# Patient Record
Sex: Female | Born: 1970 | Race: Black or African American | Hispanic: No | Marital: Married | Smoking: Never smoker
Health system: Southern US, Community
[De-identification: ages and names within clinical notes are randomized; demographics above are authoritative.]

## PROBLEM LIST (undated history)

## (undated) DIAGNOSIS — Z9889 Other specified postprocedural states: Secondary | ICD-10-CM

## (undated) DIAGNOSIS — D649 Anemia, unspecified: Secondary | ICD-10-CM

## (undated) DIAGNOSIS — E669 Obesity, unspecified: Secondary | ICD-10-CM

## (undated) DIAGNOSIS — I509 Heart failure, unspecified: Secondary | ICD-10-CM

## (undated) DIAGNOSIS — O1490 Unspecified pre-eclampsia, unspecified trimester: Secondary | ICD-10-CM

## (undated) DIAGNOSIS — K8020 Calculus of gallbladder without cholecystitis without obstruction: Secondary | ICD-10-CM

## (undated) DIAGNOSIS — I2699 Other pulmonary embolism without acute cor pulmonale: Secondary | ICD-10-CM

## (undated) DIAGNOSIS — R0602 Shortness of breath: Secondary | ICD-10-CM

## (undated) DIAGNOSIS — R112 Nausea with vomiting, unspecified: Secondary | ICD-10-CM

## (undated) DIAGNOSIS — R51 Headache: Secondary | ICD-10-CM

## (undated) DIAGNOSIS — L509 Urticaria, unspecified: Secondary | ICD-10-CM

## (undated) DIAGNOSIS — I10 Essential (primary) hypertension: Secondary | ICD-10-CM

## (undated) HISTORY — PX: HYSTEROSCOPY W/ ENDOMETRIAL ABLATION: SUR665

---

## 1994-05-03 HISTORY — PX: CHOLECYSTECTOMY: SHX55

## 1999-01-02 HISTORY — PX: SKIN BIOPSY: SHX1

## 2002-05-03 HISTORY — PX: DILATION AND CURETTAGE OF UTERUS: SHX78

## 2003-05-04 DIAGNOSIS — I2699 Other pulmonary embolism without acute cor pulmonale: Secondary | ICD-10-CM

## 2003-05-04 HISTORY — DX: Other pulmonary embolism without acute cor pulmonale: I26.99

## 2012-08-25 ENCOUNTER — Emergency Department (HOSPITAL_COMMUNITY): Payer: BC Managed Care – PPO

## 2012-08-25 ENCOUNTER — Encounter (HOSPITAL_COMMUNITY): Payer: Self-pay | Admitting: *Deleted

## 2012-08-25 ENCOUNTER — Inpatient Hospital Stay (HOSPITAL_COMMUNITY)
Admission: EM | Admit: 2012-08-25 | Discharge: 2012-08-28 | DRG: 544 | Disposition: A | Discharge status: Home / Self Care | Payer: BC Managed Care – PPO | Attending: Internal Medicine | Admitting: Internal Medicine

## 2012-08-25 DIAGNOSIS — R7989 Other specified abnormal findings of blood chemistry: Secondary | ICD-10-CM

## 2012-08-25 DIAGNOSIS — R21 Rash and other nonspecific skin eruption: Secondary | ICD-10-CM | POA: Diagnosis present

## 2012-08-25 DIAGNOSIS — I10 Essential (primary) hypertension: Secondary | ICD-10-CM | POA: Diagnosis present

## 2012-08-25 DIAGNOSIS — I517 Cardiomegaly: Secondary | ICD-10-CM | POA: Diagnosis present

## 2012-08-25 DIAGNOSIS — I509 Heart failure, unspecified: Secondary | ICD-10-CM | POA: Diagnosis present

## 2012-08-25 DIAGNOSIS — Z6841 Body Mass Index (BMI) 40.0 and over, adult: Secondary | ICD-10-CM

## 2012-08-25 DIAGNOSIS — J811 Chronic pulmonary edema: Secondary | ICD-10-CM

## 2012-08-25 DIAGNOSIS — R799 Abnormal finding of blood chemistry, unspecified: Secondary | ICD-10-CM

## 2012-08-25 DIAGNOSIS — L509 Urticaria, unspecified: Secondary | ICD-10-CM | POA: Diagnosis present

## 2012-08-25 DIAGNOSIS — R0989 Other specified symptoms and signs involving the circulatory and respiratory systems: Secondary | ICD-10-CM

## 2012-08-25 DIAGNOSIS — R0600 Dyspnea, unspecified: Secondary | ICD-10-CM

## 2012-08-25 DIAGNOSIS — J9600 Acute respiratory failure, unspecified whether with hypoxia or hypercapnia: Secondary | ICD-10-CM | POA: Diagnosis present

## 2012-08-25 DIAGNOSIS — I5031 Acute diastolic (congestive) heart failure: Principal | ICD-10-CM | POA: Diagnosis present

## 2012-08-25 DIAGNOSIS — I43 Cardiomyopathy in diseases classified elsewhere: Secondary | ICD-10-CM | POA: Diagnosis present

## 2012-08-25 DIAGNOSIS — R0902 Hypoxemia: Secondary | ICD-10-CM

## 2012-08-25 DIAGNOSIS — I110 Hypertensive heart disease with heart failure: Secondary | ICD-10-CM | POA: Diagnosis present

## 2012-08-25 DIAGNOSIS — J9601 Acute respiratory failure with hypoxia: Secondary | ICD-10-CM

## 2012-08-25 DIAGNOSIS — R51 Headache: Secondary | ICD-10-CM | POA: Diagnosis present

## 2012-08-25 DIAGNOSIS — R0603 Acute respiratory distress: Secondary | ICD-10-CM

## 2012-08-25 DIAGNOSIS — E669 Obesity, unspecified: Secondary | ICD-10-CM | POA: Diagnosis present

## 2012-08-25 DIAGNOSIS — D72829 Elevated white blood cell count, unspecified: Secondary | ICD-10-CM | POA: Diagnosis present

## 2012-08-25 DIAGNOSIS — Z86718 Personal history of other venous thrombosis and embolism: Secondary | ICD-10-CM | POA: Insufficient documentation

## 2012-08-25 DIAGNOSIS — R0609 Other forms of dyspnea: Secondary | ICD-10-CM

## 2012-08-25 HISTORY — DX: Shortness of breath: R06.02

## 2012-08-25 HISTORY — DX: Headache: R51

## 2012-08-25 HISTORY — DX: Anemia, unspecified: D64.9

## 2012-08-25 HISTORY — DX: Nausea with vomiting, unspecified: R11.2

## 2012-08-25 HISTORY — DX: Obesity, unspecified: E66.9

## 2012-08-25 HISTORY — DX: Essential (primary) hypertension: I10

## 2012-08-25 HISTORY — DX: Calculus of gallbladder without cholecystitis without obstruction: K80.20

## 2012-08-25 HISTORY — DX: Heart failure, unspecified: I50.9

## 2012-08-25 HISTORY — DX: Other pulmonary embolism without acute cor pulmonale: I26.99

## 2012-08-25 HISTORY — DX: Other specified postprocedural states: Z98.890

## 2012-08-25 HISTORY — DX: Urticaria, unspecified: L50.9

## 2012-08-25 HISTORY — DX: Unspecified pre-eclampsia, unspecified trimester: O14.90

## 2012-08-25 LAB — CBC WITH DIFFERENTIAL/PLATELET
Basophils Absolute: 0 10*3/uL (ref 0.0–0.1)
Basophils Relative: 0 % (ref 0–1)
Eosinophils Absolute: 0.1 10*3/uL (ref 0.0–0.7)
Eosinophils Relative: 0 % (ref 0–5)
HCT: 37.3 % (ref 36.0–46.0)
Lymphocytes Relative: 16 % (ref 12–46)
MCH: 27.7 pg (ref 26.0–34.0)
MCHC: 32.4 g/dL (ref 30.0–36.0)
MCV: 85.4 fL (ref 78.0–100.0)
Monocytes Absolute: 0.7 10*3/uL (ref 0.1–1.0)
RDW: 13.2 % (ref 11.5–15.5)

## 2012-08-25 LAB — CBC
MCH: 27.8 pg (ref 26.0–34.0)
MCHC: 32.6 g/dL (ref 30.0–36.0)
MCV: 85.2 fL (ref 78.0–100.0)
Platelets: 174 10*3/uL (ref 150–400)
RDW: 13.2 % (ref 11.5–15.5)

## 2012-08-25 LAB — D-DIMER, QUANTITATIVE: D-Dimer, Quant: 0.9 ug/mL-FEU — ABNORMAL HIGH (ref 0.00–0.48)

## 2012-08-25 LAB — URINALYSIS, ROUTINE W REFLEX MICROSCOPIC
Glucose, UA: NEGATIVE mg/dL
Leukocytes, UA: NEGATIVE
Protein, ur: NEGATIVE mg/dL
Specific Gravity, Urine: 1.023 (ref 1.005–1.030)
Urobilinogen, UA: 0.2 mg/dL (ref 0.0–1.0)

## 2012-08-25 LAB — COMPREHENSIVE METABOLIC PANEL
AST: 14 U/L (ref 0–37)
CO2: 26 mEq/L (ref 19–32)
Calcium: 9.4 mg/dL (ref 8.4–10.5)
Creatinine, Ser: 0.74 mg/dL (ref 0.50–1.10)
GFR calc Af Amer: >90 mL/min (ref >90)
GFR calc non Af Amer: >90 mL/min (ref >90)
Total Protein: 8.3 g/dL (ref 6.0–8.3)

## 2012-08-25 LAB — RAPID URINE DRUG SCREEN, HOSP PERFORMED
Amphetamines: NOT DETECTED
Barbiturates: NOT DETECTED
Benzodiazepines: NOT DETECTED

## 2012-08-25 LAB — TROPONIN I
Troponin I: <0.3 ng/mL (ref <0.30)
Troponin I: <0.3 ng/mL (ref <0.30)

## 2012-08-25 LAB — RPR: RPR Ser Ql: NONREACTIVE

## 2012-08-25 LAB — CREATININE, SERUM: Creatinine, Ser: 0.68 mg/dL (ref 0.50–1.10)

## 2012-08-25 MED ORDER — ENOXAPARIN SODIUM 40 MG/0.4ML ~~LOC~~ SOLN
40.0000 mg | SUBCUTANEOUS | Status: DC
  Administered 2012-08-25 – 2012-08-28 (×4): 40 mg via SUBCUTANEOUS
  Filled 2012-08-25 (×4): qty 0.4

## 2012-08-25 MED ORDER — SODIUM CHLORIDE 0.9 % IJ SOLN: 3.0000 mL | INTRAMUSCULAR | Status: DC | PRN

## 2012-08-25 MED ORDER — METOPROLOL TARTRATE 12.5 MG HALF TABLET
12.5000 mg | ORAL_TABLET | Freq: Two times a day (BID) | ORAL | Status: DC
  Administered 2012-08-25 (×2): 12.5 mg via ORAL
  Filled 2012-08-25 (×4): qty 1

## 2012-08-25 MED ORDER — HYDROMORPHONE HCL PF 1 MG/ML IJ SOLN
1.0000 mg | INTRAMUSCULAR | Status: AC
  Administered 2012-08-25: 1 mg via INTRAVENOUS
  Filled 2012-08-25: qty 1

## 2012-08-25 MED ORDER — ASPIRIN EC 81 MG PO TBEC
81.0000 mg | DELAYED_RELEASE_TABLET | Freq: Every day | ORAL | Status: DC
  Administered 2012-08-25 – 2012-08-28 (×4): 81 mg via ORAL
  Filled 2012-08-25 (×4): qty 1

## 2012-08-25 MED ORDER — HYDROXYZINE HCL 10 MG PO TABS
10.0000 mg | ORAL_TABLET | Freq: Three times a day (TID) | ORAL | Status: DC | PRN
  Administered 2012-08-25: 10 mg via ORAL
  Filled 2012-08-25: qty 1

## 2012-08-25 MED ORDER — HYDROCODONE-ACETAMINOPHEN 5-325 MG PO TABS: 1.0000 | ORAL_TABLET | Freq: Four times a day (QID) | ORAL | Status: DC | PRN

## 2012-08-25 MED ORDER — NITROGLYCERIN IN D5W 200-5 MCG/ML-% IV SOLN
2.0000 ug/min | Freq: Once | INTRAVENOUS | Status: AC
  Administered 2012-08-25: 50 ug/min via INTRAVENOUS
  Filled 2012-08-25: qty 250

## 2012-08-25 MED ORDER — HYDROMORPHONE HCL PF 1 MG/ML IJ SOLN
0.5000 mg | INTRAMUSCULAR | Status: AC
  Administered 2012-08-25: 05:00:00 via INTRAVENOUS
  Filled 2012-08-25: qty 1

## 2012-08-25 MED ORDER — IPRATROPIUM BROMIDE 0.02 % IN SOLN
0.5000 mg | Freq: Once | RESPIRATORY_TRACT | Status: AC
  Administered 2012-08-25: 0.5 mg via RESPIRATORY_TRACT
  Filled 2012-08-25: qty 2.5

## 2012-08-25 MED ORDER — ACETAMINOPHEN 325 MG PO TABS: 650.0000 mg | ORAL_TABLET | ORAL | Status: DC | PRN

## 2012-08-25 MED ORDER — LISINOPRIL 10 MG PO TABS
10.0000 mg | ORAL_TABLET | Freq: Every day | ORAL | Status: DC
  Administered 2012-08-25: 10 mg via ORAL
  Filled 2012-08-25 (×2): qty 1

## 2012-08-25 MED ORDER — SODIUM CHLORIDE 0.9 % IJ SOLN
3.0000 mL | Freq: Two times a day (BID) | INTRAMUSCULAR | Status: DC
  Administered 2012-08-25 – 2012-08-28 (×6): 3 mL via INTRAVENOUS

## 2012-08-25 MED ORDER — FUROSEMIDE 10 MG/ML IJ SOLN
40.0000 mg | Freq: Once | INTRAMUSCULAR | Status: AC
  Administered 2012-08-25: 40 mg via INTRAVENOUS
  Filled 2012-08-25: qty 4

## 2012-08-25 MED ORDER — ONDANSETRON HCL 4 MG/2ML IJ SOLN: 4.0000 mg | Freq: Three times a day (TID) | INTRAMUSCULAR | Status: DC | PRN

## 2012-08-25 MED ORDER — PROMETHAZINE HCL 25 MG/ML IJ SOLN
12.5000 mg | Freq: Once | INTRAMUSCULAR | Status: DC
  Filled 2012-08-25: qty 1

## 2012-08-25 MED ORDER — POTASSIUM CHLORIDE CRYS ER 20 MEQ PO TBCR
20.0000 meq | EXTENDED_RELEASE_TABLET | Freq: Two times a day (BID) | ORAL | Status: DC
  Administered 2012-08-25 – 2012-08-28 (×7): 20 meq via ORAL
  Filled 2012-08-25 (×9): qty 1

## 2012-08-25 MED ORDER — ALBUTEROL SULFATE (5 MG/ML) 0.5% IN NEBU
5.0000 mg | INHALATION_SOLUTION | Freq: Once | RESPIRATORY_TRACT | Status: AC
  Administered 2012-08-25: 5 mg via RESPIRATORY_TRACT
  Filled 2012-08-25: qty 1

## 2012-08-25 MED ORDER — ALBUTEROL SULFATE (5 MG/ML) 0.5% IN NEBU: 2.5000 mg | INHALATION_SOLUTION | RESPIRATORY_TRACT | Status: DC | PRN

## 2012-08-25 MED ORDER — FUROSEMIDE 10 MG/ML IJ SOLN
40.0000 mg | Freq: Every day | INTRAMUSCULAR | Status: DC
  Administered 2012-08-25: 40 mg via INTRAVENOUS
  Filled 2012-08-25 (×2): qty 4

## 2012-08-25 MED ORDER — ONDANSETRON HCL 4 MG/2ML IJ SOLN: 4.0000 mg | Freq: Four times a day (QID) | INTRAMUSCULAR | Status: DC | PRN

## 2012-08-25 MED ORDER — CLOTRIMAZOLE 1 % EX CREA
TOPICAL_CREAM | Freq: Two times a day (BID) | CUTANEOUS | Status: DC
  Administered 2012-08-25 – 2012-08-28 (×7): via TOPICAL
  Filled 2012-08-25: qty 15

## 2012-08-25 MED ORDER — SODIUM CHLORIDE 0.9 % IV SOLN: 250.0000 mL | INTRAVENOUS | Status: DC | PRN

## 2012-08-25 MED ORDER — IOHEXOL 350 MG/ML SOLN
100.0000 mL | Freq: Once | INTRAVENOUS | Status: AC | PRN
  Administered 2012-08-25: 80 mL via INTRAVENOUS

## 2012-08-25 MED ORDER — PROMETHAZINE HCL 25 MG/ML IJ SOLN
25.0000 mg | Freq: Once | INTRAMUSCULAR | Status: AC
  Administered 2012-08-25: 25 mg via INTRAVENOUS
  Filled 2012-08-25: qty 1

## 2012-08-25 NOTE — ED Provider Notes (Signed)
History     CSN: [id number]  Arrival date & time 08/25/12  0122   First MD Initiated Contact with Patient 08/25/12 0131      Chief Complaint  Patient presents with  . Respiratory Distress   History obtained per patient and EMS report HPI patient arrives with respiratory difficulty, she was picked up by EMS for respiratory distress. Patient says she woke up early this morning with a cough that was productive of scant amounts of foamy pink colored sputum, shortness of breath but denies any chest pain. Patient has a history of hypertension, PE and obesity.  Family adds history of medication non-compliance.  Patient's symptoms were severe, have been constant but getting better after CPAP therapy via EMS, 1 sublingual nitroglycerin and one treatment.  O2 sats on arrival were 86%, respiratory rate 36. Per EMS, they said they heard audible rales, wheezing.  No other alleviating or exacerbating factors no associated symptoms. Patient frankly denies any chest pain, belly pain, nausea, vomiting, recent fevers, chills.  Past Medical History  Diagnosis Date  . Hypertension   . Obesity   . Hx of blood clots     Lung    No past surgical history on file.  No family history on file.  History  Substance Use Topics  . Smoking status: Never Smoker   . Smokeless tobacco: Not on file  . Alcohol Use: No    OB History   Grav Para Term Preterm Abortions TAB SAB Ect Mult Living                  Review of Systems At least 10pt or greater review of systems completed and are negative except where specified in the HPI.  Allergies  Review of patient's allergies indicates not on file.  Home Medications  No current outpatient prescriptions on file.  BP 170/142  Pulse 103  Resp 20  SpO2 100%  Physical Exam  Nursing notes reviewed.  Electronic medical record reviewed. VITAL SIGNS:   Filed Vitals:   08/25/12 0132 08/25/12 0136  BP:  170/142  Pulse: 103   Resp: 20   SpO2: 100%     CONSTITUTIONAL: Awake, oriented x4, appears to be having respiratory difficulty, can answer questions follow commands and speak in fragmented sentences HENT: Atraumatic, normocephalic, oral mucosa pink and moist, airway patent. Nares patent without drainage. External ears normal. EYES: Conjunctiva clear, EOMI, PERRLA NECK: Trachea midline, non-tender, supple CARDIOVASCULAR: Normal heart rate, Normal rhythm, No murmurs, rubs, gallops PULMONARY/CHEST: Fair air movement, coarse breath sounds bilaterally, no wheezes or rales. Tachypneic. Symmetrical breath sounds. Non-tender. ABDOMINAL: Non-distended, morbidly obese, soft, non-tender - no rebound or guarding.  BS normal. NEUROLOGIC: Non-focal, moving all four extremities, no gross sensory or motor deficits. EXTREMITIES: No clubbing, cyanosis. 1+ lower extremity pitting edema SKIN: Warm, Dry, No erythema, No rash  ED Course  CRITICAL CARE Performed by: Siple Skene Authorized by: Haverland Skene Total critical care time: 30 minutes Critical care time was exclusive of separately billable procedures and treating other patients. Critical care was necessary to treat or prevent imminent or life-threatening deterioration of the following conditions: respiratory failure. Critical care was time spent personally by me on the following activities: discussions with consultants, evaluation of patient's response to treatment, obtaining history from patient or surrogate, ordering and review of laboratory studies, pulse oximetry, review of old charts, re-evaluation of patient's condition, ordering and review of radiographic studies, ordering and performing treatments and interventions, examination of patient and development of treatment  plan with patient or surrogate.   (including critical care time)  Date: 08/25/2012  Rate: 93  Rhythm: normal sinus rhythm  QRS Axis: normal  Intervals: normal  ST/T Wave abnormalities: Flattening of T waves in V6   Conduction Disutrbances: none  Narrative Interpretation: No prior EKG for comparison, or spleen nonischemic, no ST elevations, ST depressions, mild T-wave flattening in V6 only.     Labs Reviewed  CBC WITH DIFFERENTIAL - Abnormal; Notable for the following:    WBC 13.1 (*)    Neutrophils Relative 79 (*)    Neutro Abs 10.4 (*)    All other components within normal limits  COMPREHENSIVE METABOLIC PANEL - Abnormal; Notable for the following:    Glucose, Bld 143 (*)    All other components within normal limits  PRO B NATRIURETIC PEPTIDE - Abnormal; Notable for the following:    Pro B Natriuretic peptide (BNP) 555.2 (*)    All other components within normal limits  D-DIMER, QUANTITATIVE - Abnormal; Notable for the following:    D-Dimer, Quant 0.90 (*)    All other components within normal limits  URINALYSIS, ROUTINE W REFLEX MICROSCOPIC  POCT I-STAT TROPONIN I  POCT PREGNANCY, URINE   Ct Angio Chest Pe W/cm &/or Wo Cm  08/25/2012  *RADIOLOGY REPORT*  Clinical Data: Shortness of breath.  CT ANGIOGRAPHY CHEST  Technique:  Multidetector CT imaging of the chest using the standard protocol during bolus administration of intravenous contrast. Multiplanar reconstructed images including MIPs were obtained and reviewed to evaluate the vascular anatomy.  Contrast: 80mL OMNIPAQUE IOHEXOL 350 MG/ML SOLN  Comparison: 08/25/2012 radiograph  Findings: Contrast bolus timing suboptimal.  Allowing for this, no main or central lobar pulmonary arterial branch filling defect. More peripheral branches are inadequately opacified.  Normal caliber aorta.  Cardiomegaly.  No pleural or pericardial effusion.  Limited images through the upper abdomen show no acute finding. Cholecystectomy.  Reactive sized mediastinal and hilar lymph nodes.  Bilateral ground-glass opacities, right greater than left, within the upper and lower lobes.  No pneumothorax.  Central airways are in expiration however patent.  No acute osseous  finding.  IMPRESSION: Suboptimal contrast bolus timing.  No main or central lobar pulmonary embolism.  The more peripheral branches are nondiagnostic.  Cardiomegaly.  Bilateral ground-glass opacities may reflect pulmonary edema, infectious or inflammatory pneumonitis.   Original Report Authenticated By: Jearld Lesch, M.D.    Dg Chest Port 1 View  08/25/2012  *RADIOLOGY REPORT*  Clinical Data: Respiratory distress  PORTABLE CHEST - 1 VIEW  Comparison: None.  Findings: Cardiomegaly.  Mediastinal contours otherwise within normal range.  Mild interstitial prompts.  No confluent airspace opacity.  No pleural effusion or pneumothorax.  No acute osseous finding.  IMPRESSION: Cardiomegaly.  Mild interstitial prominence may be accentuated by portable technique and patient body habitus. Mild interstitial edema not excluded.   Original Report Authenticated By: Jearld Lesch, M.D.      1. Respiratory distress   2. Elevated brain natriuretic peptide (BNP) level   3. Dyspnea   4. Hypoxia   5. Pulmonary edema     Medications  promethazine (PHENERGAN) injection 12.5 mg (not administered)  furosemide (LASIX) injection 40 mg (40 mg Intravenous Given 08/25/12 0153)  albuterol (PROVENTIL) (5 MG/ML) 0.5% nebulizer solution 5 mg (5 mg Nebulization Given 08/25/12 0207)  ipratropium (ATROVENT) nebulizer solution 0.5 mg (0.5 mg Nebulization Given 08/25/12 0207)  nitroGLYCERIN 0.2 mg/mL in dextrose 5 % infusion (0 mcg/min Intravenous Stopped 08/25/12 0714)  HYDROmorphone (DILAUDID) injection 0.5 mg ( Intravenous Given 08/25/12 0443)  iohexol (OMNIPAQUE) 350 MG/ML injection 100 mL (80 mLs Intravenous Contrast Given 08/25/12 0522)  HYDROmorphone (DILAUDID) injection 1 mg (1 mg Intravenous Given 08/25/12 0719)  promethazine (PHENERGAN) injection 25 mg (25 mg Intravenous Given 08/25/12 0719)     MDM  Jacqueline Kirk is a 42 y.o. female presenting in respiratory distress via EMS, patient was given nitroglycerin on CPAP in  the field but she responded very well to, patient appears to be responding well secondary to increased preload, blood pressure is extremely high. Patient given 2 sublingual nitroglycerin to reduce her blood pressure, then put on a nitroglycerin drip. Respiratory: The bedside immediately put on BiPAP. Patient is having no chest pain, she's able to give me history, she's had some medication noncompliance and history of hypertension.   Likely flash pulmonary edema secondary to hypertensive emergency. Patient does have a history of pulmonary embolism. Will get the patient stabilized and likely require a CT angiogram of the chest, will obtain a d-dimer although my suspicion is that it will be positive. I do have a low suspicion that pathology today is caused by a PE.   Patient given Lasix, kept on a nitroglycerin drip, blood pressures into the 150s and 160s, patient had a large amount of urine output. Chest x-ray consistent with some pulmonary edema.  Patient was able to be transitioned to nonrebreather mask.  CT of the chest shows a suboptimal contrast bolus however no PE in the main or central lobar arteries.  CT also comments on bilateral groundglass opacities may reflect re\re pulmonary edema. Also reflects on cardiomegaly. Patient does have obvious cardiomegaly on x-ray, though it was a portable x-ray.    I think the patient will need to be admitted although she is off oxygen at this point, she is shown signs of hypertensive emergency as well as systolic heart failure which is new onset. She has no history of this previously and she has an enlarged heart. She has an increased BNP. Discussed patient with Dr. Malachi Bonds for admission to telemetry, observation.        Sou Skene, MD 08/25/12 [phone]

## 2012-08-25 NOTE — ED Notes (Addendum)
Coming from home; woke up with sob; ems on arrival- audible rales, sats 80's; placed on cpap 5, 5mg  alubterol, piv 20 rt. A/c. Vs. Bp. 190/110, hr 100, sao2 98, and rr 30s. ems gave nitro sl. 0.4mg  to lower bp. Productive cough started tonite.

## 2012-08-25 NOTE — Plan of Care (Signed)
Problem: Food- and Nutrition-Related Knowledge Deficit (NB-1.1) Goal: Nutrition education Formal process to instruct or train a patient/client in a skill or to impart knowledge to help patients/clients voluntarily manage or modify food choices and eating behavior to maintain or improve health. Outcome: Completed/Met Date Met:  08/25/12 Nutrition Education Note  RD consulted for nutrition education regarding new onset CHF.   Pt did not want to talk about her diet, on several occasions roller her eyes and stated "I'm not doing that".  Family in room is a little more receptive, but still with limited understanding of how important a low sodium diet is for the pt. Pt and family denied knowing that the pt has any heart problems, only report high BP.  Spoke with RN about confirming CHF with pt/family. Encouraged family that this diet would be beneficial for all members of the household, though they state they will not give up salt.   RD provided "Heart Healthy Nutrition Therapy" handout from the [employer]. Reviewed patient's dietary recall. Provided examples on ways to decrease sodium and fat intake in diet. Discouraged intake of processed foods and use of salt shaker. Encouraged fresh fruits and vegetables as well as whole grain sources of carbohydrates to maximize fiber intake.     Teach back method used.  Expect poor compliance.  Body mass index is 65.51 kg/(m^2). Pt meets criteria for obesity class 3, extreme  based on current BMI.  Current diet order is heart, no meals documented at this time. Pt/family had fast food items in room. Labs and medications reviewed. No further nutrition interventions warranted at this time. RD contact information provided. If additional nutrition issues arise, please re-consult RD.  Clarene Duke RD, LDN Pager [phone] After Hours pager [phone]

## 2012-08-25 NOTE — H&P (Signed)
[facility] History and Physical  Phylisha Dix ZOX:[id number] DOB: January 06, 1971 DOA: 08/25/2012  Referring physician:  Dr. Rulon Abide PCP:  No PCP Per Patient   Chief Complaint:  Shortness of breath  HPI:  The patient is a 42 y.o. year-old female with history of HTN, obesity, and pregnancy-related PE who presents with acute onset shortness of breth.  The patient was last at their baseline health a week ago.  She has had intermittent hives for the last week with some itching and numbness of the throat.  Otherwise, she has felt well.  She has had some occasional chills, but denies recent fevers, rhinorrhea, sinus congestion, cough, PND, orthopnea, LEE.  Yesterday, she developed some cough which worsened in the evening, becoming productive of pink frothy sputum.  She had associated wheezing and SOB.  Her family called EMS when she started having difficulty breathing.  When EMS arrived, she had audible rales and wheezing from across the room and was in moderate respiratory distress.  She vomited once.  Her BP was 190/110, RR 30s, O2 sat in 80s.  She was given a nebulizer treatment which helped a little.  She was also given NTG and started on cpap +5, which helped her breathing.  In the ER, she was started on a NTG gtt and given lasix.  She has made "a lot" but unquantified amount of urine.  Her breathing improved and she was weaned from the NTG gtt and oxygen.  CXR demonstrated pulmonary edema vs. PNA and she underwent CTa chest due to elevated D-dimer and hx of PE which was negative for PE.  CXR and CTa both demonstrated pulmonary edema and cardiomegaly.   She is being admitted for work up of probable new onset systolic heart failure with acute exacerbation.    Review of Systems:  Denies fevers, weight loss or gain, changes to hearing and vision.  Denies rhinorrhea, sinus congestion, sore throat.  Denies chest pain and palpitations.  + SOB, wheezing, cough.  Denies prior nausea, vomiting, constipation,  diarrhea.  Denies dysuria, frequency, urgency, polyuria, polydipsia.  Denies hematemesis, blood in stools, melena, abnormal bruising or bleeding.  Denies lymphadenopathy.  Denies arthralgias, myalgias.  Has a rash on the lateral aspect of the right ankle.  Denies lower extremity edema.  Numbness of mouth has since resolved.  Denies weakness, slurred speech, confusion, facial droop.  Denies anxiety and depression.  Headache in ER was treated with dilaudid.    Past Medical History  Diagnosis Date  . Hypertension   . Obesity   . Hx of blood clots     Lung  . Hives   . Gallstones     s/p cholecystectomy  . Preeclampsia    Past Surgical History  Procedure Laterality Date  . Cholecystectomy    . Ablation      "so I can't have kids"   Social History:  reports that she has never smoked. She does not have any smokeless tobacco history on file. She reports that she does not drink alcohol or use illicit drugs. Lives with fiance and two daughters.  Works at [employer] and completes ADLs without assistance  No Known Allergies  Family History  Problem Relation Age of Onset  . Stroke Brother   . Diabetes Brother   . Cirrhosis      multiple family members  . Alcoholism      multiple family members     Prior to Admission medications   Medication Sig Start Date End Date Taking?  Authorizing Provider  lisinopril (PRINIVIL,ZESTRIL) 10 MG tablet Take 10 mg by mouth daily.   Yes Historical Provider, MD   Physical Exam: Filed Vitals:   08/25/12 0715 08/25/12 0730 08/25/12 0745 08/25/12 0800  BP: 166/78 165/88 121/107 141/96  Pulse:  74 79 82  Resp: 18 15 17 23   SpO2: 94% 91% 93% 98%     General:  Obese AAF, no acute distress, lying on stretcher  Eyes:  Pupils pinpoint and minimally reactive to light, anicteric, non-injected.  ENT:  Nares clear.  OP clear, non-erythematous without plaques or exudates.  MMM.   Neck:  Supple without TM or JVD.    Lymph:  No cervical, supraclavicular,  or submandibular LAD.  Cardiovascular:  RRR, normal S1, S2, without m/r/g.  2+ pulses, warm extremities  Respiratory:  Rales at the bilateral bases without wheeze or rhonchi, no increased WOB.  Abdomen:  NABS.  Soft, ND/NT.    Skin:   Right ankle with a 5cm circular plaque with raised and lichenified border and central clearing/scarring.    Musculoskeletal:  Normal bulk and tone.  No LE edema.  Psychiatric:  Asleep and difficult to arouse long enough to answer questions.  Appropriate affect.  Neurologic:  Grossly intact.  Grossly moves all extremities.  No facial droop.      Labs on Admission:  Basic Metabolic Panel:  Recent Labs Lab 08/25/12 0332  NA 139  K 3.5  CL 101  CO2 26  GLUCOSE 143*  BUN 12  CREATININE 0.74  CALCIUM 9.4   Liver Function Tests:  Recent Labs Lab 08/25/12 0332  AST 14  ALT 6  ALKPHOS 87  BILITOT 0.3  PROT 8.3  ALBUMIN 3.6   No results found for this basename: LIPASE, AMYLASE,  in the last 168 hours No results found for this basename: AMMONIA,  in the last 168 hours CBC:  Recent Labs Lab 08/25/12 0332  WBC 13.1*  NEUTROABS 10.4*  HGB 12.1  HCT 37.3  MCV 85.4  PLT 187   Cardiac Enzymes: No results found for this basename: CKTOTAL, CKMB, CKMBINDEX, TROPONINI,  in the last 168 hours  BNP (last 3 results)  Recent Labs  08/25/12 0332  PROBNP 555.2*   CBG: No results found for this basename: GLUCAP,  in the last 168 hours  Radiological Exams on Admission: Ct Angio Chest Pe W/cm &/or Wo Cm  08/25/2012  *RADIOLOGY REPORT*  Clinical Data: Shortness of breath.  CT ANGIOGRAPHY CHEST  Technique:  Multidetector CT imaging of the chest using the standard protocol during bolus administration of intravenous contrast. Multiplanar reconstructed images including MIPs were obtained and reviewed to evaluate the vascular anatomy.  Contrast: 80mL OMNIPAQUE IOHEXOL 350 MG/ML SOLN  Comparison: 08/25/2012 radiograph  Findings: Contrast bolus  timing suboptimal.  Allowing for this, no main or central lobar pulmonary arterial branch filling defect. More peripheral branches are inadequately opacified.  Normal caliber aorta.  Cardiomegaly.  No pleural or pericardial effusion.  Limited images through the upper abdomen show no acute finding. Cholecystectomy.  Reactive sized mediastinal and hilar lymph nodes.  Bilateral ground-glass opacities, right greater than left, within the upper and lower lobes.  No pneumothorax.  Central airways are in expiration however patent.  No acute osseous finding.  IMPRESSION: Suboptimal contrast bolus timing.  No main or central lobar pulmonary embolism.  The more peripheral branches are nondiagnostic.  Cardiomegaly.  Bilateral ground-glass opacities may reflect pulmonary edema, infectious or inflammatory pneumonitis.   Original Report Authenticated By:  Jearld Lesch, M.D.    Dg Chest Port 1 View  08/25/2012  *RADIOLOGY REPORT*  Clinical Data: Respiratory distress  PORTABLE CHEST - 1 VIEW  Comparison: None.  Findings: Cardiomegaly.  Mediastinal contours otherwise within normal range.  Mild interstitial prompts.  No confluent airspace opacity.  No pleural effusion or pneumothorax.  No acute osseous finding.  IMPRESSION: Cardiomegaly.  Mild interstitial prominence may be accentuated by portable technique and patient body habitus. Mild interstitial edema not excluded.   Original Report Authenticated By: Jearld Lesch, M.D.     EKG:  NSR, rate 93, PVC.  Possibly flattened or inverted T-waves in the lateral leads , I, aVL, but difficult to tell given baseline.  No ST segment depressions or elevations.    Assessment/Plan Principal Problem:   Pulmonary edema Active Problems:   Hypertension   Obesity   Cardiomegaly   Hives   Rash and nonspecific skin eruption   Acute respiratory failure with hypoxia   Leukocytosis  Cardiomegaly and pulmonary edema, likely acute systolic heart failure, new onset with resultant  acute hypoxic respiratory failure which required bipap.  May also have had acute diastolic heart failure from stressor or medication.  Systolic heart failure:  Family hx of EtOH use, however, pt denies.  Will rule out ischemia, thyroid, lupus, hemochromatosis.  Patient too sleepy to consent for HIV testing at this time.   -  Admit to observation and CM to review for possible admission status -  Telemetry -  Cycle troponins -  Strict I/O, daily weights -  Lasix 40mg  IV once daily -  Potassium BID -  ECHO with contrast ASAP -  Continue ACEI and start low dose BB and baby aspirin -  Ferritin, ANA, TSH, RPR -  Cardiology consult for new onset systolic heart failure if present -  Monitor for evidence of pneumonia -  Consent for HIV testing once more awake -  Avoid NSAIDs  -  Urine drug screen  Hives, intermittent for the last week.  May be environmental allergy or reaction to viral illness.  -  Hydroxyzine prn  HA, likely related to NTG -  Minimize narcotics  Rash on right lower extremity:  May be tinea corp -  Start clotrimazole cream  HTN, blood pressure was elevated at home but currently wnl.  -  Continue lisinopril at home dose to titrate as BP tolerates -  Add BB  Leukocytosis, likely reactive from dyspnea.  CXR findings more consistent with pulmonary edema and pneumonia would not have improved with diuresis.   -  F/u UA -  Trend WBC  Obesity:    -  Nutrition consult  Diet:  Healthy heart, 2gm sodium, 1.5L  Access:  PIV IVF:  OFF Proph:  lovenox  Code Status: full Family Communication: spoke with patient, her daughter and her friend Disposition Plan: admit to observation telemetry  Time spent: 60 min  Renae Fickle [facility] Pager [phone]  If 7PM-7AM, please contact night-coverage [email or web address] Password [facility] 08/25/2012, 8:22 AM

## 2012-08-25 NOTE — Progress Notes (Signed)
  Echocardiogram 2D Echocardiogram has been performed.  Georgian Co 08/25/2012, 5:48 PM

## 2012-08-25 NOTE — ED Notes (Signed)
Hospitalist at the bedside 

## 2012-08-26 DIAGNOSIS — J9600 Acute respiratory failure, unspecified whether with hypoxia or hypercapnia: Secondary | ICD-10-CM

## 2012-08-26 LAB — BASIC METABOLIC PANEL
CO2: 25 mEq/L (ref 19–32)
Chloride: 104 mEq/L (ref 96–112)
GFR calc non Af Amer: 87 mL/min — ABNORMAL LOW (ref >90)
Glucose, Bld: 103 mg/dL — ABNORMAL HIGH (ref 70–99)
Potassium: 4.1 mEq/L (ref 3.5–5.1)
Sodium: 138 mEq/L (ref 135–145)

## 2012-08-26 LAB — CBC
HCT: 36 % (ref 36.0–46.0)
Hemoglobin: 11.6 g/dL — ABNORMAL LOW (ref 12.0–15.0)
MCH: 27.6 pg (ref 26.0–34.0)
RBC: 4.2 MIL/uL (ref 3.87–5.11)

## 2012-08-26 LAB — TROPONIN I: Troponin I: <0.3 ng/mL (ref <0.30)

## 2012-08-26 MED ORDER — FUROSEMIDE 10 MG/ML IJ SOLN
40.0000 mg | Freq: Two times a day (BID) | INTRAMUSCULAR | Status: DC
  Administered 2012-08-26 – 2012-08-28 (×5): 40 mg via INTRAVENOUS
  Filled 2012-08-26 (×6): qty 4

## 2012-08-26 MED ORDER — LISINOPRIL 20 MG PO TABS
20.0000 mg | ORAL_TABLET | Freq: Every day | ORAL | Status: DC
  Administered 2012-08-26 – 2012-08-28 (×3): 20 mg via ORAL
  Filled 2012-08-26 (×3): qty 1

## 2012-08-26 MED ORDER — AMLODIPINE BESYLATE 5 MG PO TABS
5.0000 mg | ORAL_TABLET | Freq: Every day | ORAL | Status: DC
  Administered 2012-08-26 – 2012-08-27 (×2): 5 mg via ORAL
  Filled 2012-08-26 (×3): qty 1

## 2012-08-26 MED ORDER — BUTALBITAL-APAP-CAFFEINE 50-325-40 MG PO TABS
1.0000 | ORAL_TABLET | ORAL | Status: DC | PRN
  Administered 2012-08-26: 1 via ORAL
  Filled 2012-08-26: qty 1

## 2012-08-26 MED ORDER — METOPROLOL TARTRATE 25 MG PO TABS
25.0000 mg | ORAL_TABLET | Freq: Two times a day (BID) | ORAL | Status: DC
  Administered 2012-08-26 – 2012-08-28 (×5): 25 mg via ORAL
  Filled 2012-08-26 (×6): qty 1

## 2012-08-26 NOTE — Progress Notes (Signed)
[facility] PROGRESS NOTE  Jacqueline Kirk WUJ:[id number] DOB: 01/05/1971 DOA: 08/25/2012 PCP: No PCP Per Patient  Assessment/Plan:  Cardiomegaly and pulmonary edema, secondary to  acute congestive heart failure, new onset with resultant acute hypoxic respiratory failure which required bipap briefly in the ED. 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 May be environmental allergy or reaction to viral illness.  - Hydroxyzine prn    Headache  patient with history of migraines. Plan for Fioricet prn     Rash on right lower extremity: May be tinea corp  - Start clotrimazole cream   HTN, blood pressure was elevated at home but currently wnl.  - started lisinopril at home dose and titrated up to 20 mg daily 4/26. BB added 4/25 and titrated up on 4/26. Norvasc 5 QHS added 4/26   Leukocytosis, likely reactive from pulm edema  Resolved by 4/26    Code Status: full Family Communication: daughter  Disposition Plan: home   HPI/Subjective: C/o swelling   Objective: Filed Vitals:   08/25/12 1127 08/25/12 1504 08/25/12 2043 08/26/12 0607  BP: 136/78 131/71 152/101 179/109  Pulse: 76 91 88 79  Temp:  98.3 F (36.8 C) 99.2 F (37.3 C) 98.1 F (36.7 C)  TempSrc:  Oral Oral Oral  Resp:  16 18 18   Height:      Weight:    146.6 kg (323 lb 3.1 oz)  SpO2:  100% 96% 99%   Patient Vitals for the past 24 hrs:  BP Temp Temp src Pulse Resp SpO2 Height Weight  08/26/12 0607 179/109 mmHg 98.1 F (36.7 C) Oral 79 18 99 % - 146.6 kg (323 lb 3.1 oz)  08/25/12 2043 152/101 mmHg 99.2 F (37.3 C) Oral 88 18 96 % - -  08/25/12 1504 131/71 mmHg 98.3 F (36.8 C) Oral 91 16 100 % - -  08/25/12 1127 136/78 mmHg - - 76  - - - -  08/25/12 0923 151/88 mmHg 98 F (36.7 C) Oral 79 16 99 % 4\' 11"  (1.499 m) 147.2 kg (324 lb 8.3 oz)  08/25/12 0902 - 98.5 F (36.9 C) Oral - - - - -  08/25/12 0830 146/86 mmHg - - 66 15 99 % - -  08/25/12 0815 146/84 mmHg - - 70 15 96 % - -  08/25/12 0800 141/96 mmHg - - 82 23 98 % - -  08/25/12 0745 121/107 mmHg - - 79 17 93 % - -     Intake/Output Summary (Last 24 hours) at 08/26/12 0739 Last data filed at 08/26/12 9562  Gross per 24 hour  Intake    480 ml  Output   2650 ml  Net  -2170 ml   Filed Weights   08/25/12 0923 08/26/12 0607  Weight: 147.2 kg (324 lb 8.3 oz) 146.6 kg (323 lb 3.1 oz)    Exam:   General:  axox3  Cardiovascular: rrr  Respiratory: bilat crackles   Abdomen: soft  Musculoskeletal: edema    Data Reviewed: Basic Metabolic Panel:  Recent Labs Lab 08/25/12 0332 08/25/12 0956  NA 139  --   K 3.5  --   CL 101  --   CO2 26  --  GLUCOSE 143*  --   BUN 12  --   CREATININE 0.74 0.68  CALCIUM 9.4  --    Liver Function Tests:  Recent Labs Lab 08/25/12 0332  AST 14  ALT 6  ALKPHOS 87  BILITOT 0.3  PROT 8.3  ALBUMIN 3.6   No results found for this basename: LIPASE, AMYLASE,  in the last 168 hours No results found for this basename: AMMONIA,  in the last 168 hours CBC:  Recent Labs Lab 08/25/12 0332 08/25/12 0956 08/26/12 0640  WBC 13.1* 11.8* 8.8  NEUTROABS 10.4*  --   --   HGB 12.1 10.9* 11.6*  HCT 37.3 33.4* 36.0  MCV 85.4 85.2 85.7  PLT 187 174 182   Cardiac Enzymes:  Recent Labs Lab 08/25/12 0956 08/25/12 1530 08/25/12 2323  TROPONINI <0.30 <0.30 <0.30   BNP (last 3 results)  Recent Labs  08/25/12 0332  PROBNP 555.2*   CBG: No results found for this basename: GLUCAP,  in the last 168 hours  No results found for this or any previous visit (from the past 240 hour(s)).   Studies: Ct Angio Chest Pe W/cm &/or Wo Cm  08/25/2012  *RADIOLOGY REPORT*  Clinical Data: Shortness of breath.  CT ANGIOGRAPHY  CHEST  Technique:  Multidetector CT imaging of the chest using the standard protocol during bolus administration of intravenous contrast. Multiplanar reconstructed images including MIPs were obtained and reviewed to evaluate the vascular anatomy.  Contrast: 80mL OMNIPAQUE IOHEXOL 350 MG/ML SOLN  Comparison: 08/25/2012 radiograph  Findings: Contrast bolus timing suboptimal.  Allowing for this, no main or central lobar pulmonary arterial branch filling defect. More peripheral branches are inadequately opacified.  Normal caliber aorta.  Cardiomegaly.  No pleural or pericardial effusion.  Limited images through the upper abdomen show no acute finding. Cholecystectomy.  Reactive sized mediastinal and hilar lymph nodes.  Bilateral ground-glass opacities, right greater than left, within the upper and lower lobes.  No pneumothorax.  Central airways are in expiration however patent.  No acute osseous finding.  IMPRESSION: Suboptimal contrast bolus timing.  No main or central lobar pulmonary embolism.  The more peripheral branches are nondiagnostic.  Cardiomegaly.  Bilateral ground-glass opacities may reflect pulmonary edema, infectious or inflammatory pneumonitis.   Original Report Authenticated By: Jearld Lesch, M.D.    Dg Chest Port 1 View  08/25/2012  *RADIOLOGY REPORT*  Clinical Data: Respiratory distress  PORTABLE CHEST - 1 VIEW  Comparison: None.  Findings: Cardiomegaly.  Mediastinal contours otherwise within normal range.  Mild interstitial prompts.  No confluent airspace opacity.  No pleural effusion or pneumothorax.  No acute osseous finding.  IMPRESSION: Cardiomegaly.  Mild interstitial prominence may be accentuated by portable technique and patient body habitus. Mild interstitial edema not excluded.   Original Report Authenticated By: Jearld Lesch, M.D.     Scheduled Meds: . aspirin EC  81 mg Oral Daily  . clotrimazole   Topical BID  . enoxaparin (LOVENOX) injection  40 mg Subcutaneous Q24H  .  furosemide  40 mg Intravenous BID  . lisinopril  20 mg Oral Daily  . metoprolol tartrate  25 mg Oral BID  . potassium chloride  20 mEq Oral BID  . sodium chloride  3 mL Intravenous Q12H   Continuous Infusions:   Principal Problem:   Pulmonary edema Active Problems:   Hypertension   Obesity   Cardiomegaly   Hives   Rash and nonspecific skin eruption   Acute respiratory failure with hypoxia  Leukocytosis        Oliver Heitzenrater  [facility] Pager [phone]. If 7PM-7AM, please contact night-coverage at [email or web address], password [facility] 08/26/2012, 7:39 AM  LOS: 1 day

## 2012-08-27 LAB — CBC
Hemoglobin: 11.9 g/dL — ABNORMAL LOW (ref 12.0–15.0)
MCH: 27.7 pg (ref 26.0–34.0)
Platelets: 181 10*3/uL (ref 150–400)
RBC: 4.3 MIL/uL (ref 3.87–5.11)
WBC: 8.1 10*3/uL (ref 4.0–10.5)

## 2012-08-27 LAB — BASIC METABOLIC PANEL
Calcium: 9.1 mg/dL (ref 8.4–10.5)
GFR calc Af Amer: >90 mL/min (ref >90)
GFR calc non Af Amer: 83 mL/min — ABNORMAL LOW (ref >90)
Potassium: 3.8 mEq/L (ref 3.5–5.1)
Sodium: 138 mEq/L (ref 135–145)

## 2012-08-27 NOTE — Progress Notes (Signed)
[facility] PROGRESS NOTE  Jacqueline Kirk ZOX:[id number] DOB: Jan 17, 1971 DOA: 08/25/2012 PCP: No PCP Per Patient  Assessment/Plan:  Cardiomegaly and pulmonary edema, secondary to  acute congestive heart failure, new onset with resultant acute hypoxic respiratory failure which required bipap briefly in the ED. 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 May be environmental allergy or reaction to viral illness.  - Hydroxyzine prn  Probably were secondary to environmental exposure because we did not witness any in the hospital.    Headache  patient with history of migraines. Plan for Fioricet prn . Headaches probably related to new blood pressure level   HTN,  - started lisinopril at home dose and titrated up to 20 mg daily 4/26. BB added 4/25 and titrated up on 4/26. Norvasc 5 QHS added 4/26   Leukocytosis, likely reactive from pulm edema  Resolved by 4/26    Code Status: full Family Communication: daughter  Disposition Plan: home   HPI/Subjective: Reports dyspnea on exertion  Objective: Filed Vitals:   08/26/12 1455 08/26/12 2041 08/27/12 0123 08/27/12 0532  BP: 124/77 132/89 138/87 131/90  Pulse: 80 88 64 74  Temp: 97.8 F (36.6 C) 98.2 F (36.8 C) 98.5 F (36.9 C) 98.1 F (36.7 C)  TempSrc: Oral Oral Oral Oral  Resp: 18 18 18 18   Height:      Weight:    144.471 kg (318 lb 8 oz)  SpO2: 99% 98% 98% 98%   Patient Vitals for the past 24 hrs:  BP Temp Temp src Pulse Resp SpO2 Weight  08/27/12 0532 131/90 mmHg 98.1 F (36.7 C) Oral 74 18 98 % 144.471 kg (318 lb 8 oz)  08/27/12 0123 138/87 mmHg 98.5 F (36.9 C) Oral 64 18 98 % -  08/26/12 2041 132/89 mmHg 98.2 F (36.8 C) Oral 88 18 98  % -  08/26/12 1455 124/77 mmHg 97.8 F (36.6 C) Oral 80 18 99 % -  08/26/12 1010 159/95 mmHg - - - - - -     Intake/Output Summary (Last 24 hours) at 08/27/12 1006 Last data filed at 08/27/12 0533  Gross per 24 hour  Intake   1263 ml  Output   3830 ml  Net  -2567 ml   Filed Weights   08/25/12 0923 08/26/12 0607 08/27/12 0532  Weight: 147.2 kg (324 lb 8.3 oz) 146.6 kg (323 lb 3.1 oz) 144.471 kg (318 lb 8 oz)    Exam:   General:  axox3  Cardiovascular: rrr  Respiratory: bilat crackles   Abdomen: soft  Musculoskeletal: edema    Data Reviewed: Basic Metabolic Panel:  Recent Labs Lab 08/25/12 0332 08/25/12 0956 08/26/12 0640 08/27/12 0644  NA 139  --  138 138  K 3.5  --  4.1 3.8  CL 101  --  104 101  CO2 26  --  25 28  GLUCOSE 143*  --  103* 100*  BUN 12  --  19 18  CREATININE 0.74 0.68 0.82 0.85  CALCIUM 9.4  --  9.2 9.1   Liver Function Tests:  Recent Labs Lab 08/25/12 0332  AST 14  ALT 6  ALKPHOS 87  BILITOT 0.3  PROT 8.3  ALBUMIN 3.6  No results found for this basename: LIPASE, AMYLASE,  in the last 168 hours No results found for this basename: AMMONIA,  in the last 168 hours CBC:  Recent Labs Lab 08/25/12 0332 08/25/12 0956 08/26/12 0640 08/27/12 0644  WBC 13.1* 11.8* 8.8 8.1  NEUTROABS 10.4*  --   --   --   HGB 12.1 10.9* 11.6* 11.9*  HCT 37.3 33.4* 36.0 36.7  MCV 85.4 85.2 85.7 85.3  PLT 187 174 182 181   Cardiac Enzymes:  Recent Labs Lab 08/25/12 0956 08/25/12 1530 08/25/12 2323  TROPONINI <0.30 <0.30 <0.30   BNP (last 3 results)  Recent Labs  08/25/12 0332  PROBNP 555.2*   CBG: No results found for this basename: GLUCAP,  in the last 168 hours  No results found for this or any previous visit (from the past 240 hour(s)).   Studies: No results found.  Scheduled Meds: . amLODipine  5 mg Oral QHS  . aspirin EC  81 mg Oral Daily  . clotrimazole   Topical BID  . enoxaparin (LOVENOX) injection  40 mg Subcutaneous  Q24H  . furosemide  40 mg Intravenous BID  . lisinopril  20 mg Oral Daily  . metoprolol tartrate  25 mg Oral BID  . potassium chloride  20 mEq Oral BID  . sodium chloride  3 mL Intravenous Q12H   Continuous Infusions:   Principal Problem:   Pulmonary edema Active Problems:   Hypertension   Obesity   Cardiomegaly   Hives   Rash and nonspecific skin eruption   Acute respiratory failure with hypoxia   Leukocytosis        Jacqueline Kirk  [facility] Pager [phone]. If 7PM-7AM, please contact night-coverage at [email or web address], password [facility] 08/27/2012, 10:06 AM  LOS: 2 days

## 2012-08-27 NOTE — Plan of Care (Signed)
Problem: Phase I Progression Outcomes Goal: EF % per last Echo/documented,Core Reminder form on chart Outcome: Completed/Met Date Met:  08/27/12 55-60%(08-25-12)

## 2012-08-28 LAB — BASIC METABOLIC PANEL
CO2: 27 mEq/L (ref 19–32)
Calcium: 9 mg/dL (ref 8.4–10.5)
Chloride: 100 mEq/L (ref 96–112)
Potassium: 4.2 mEq/L (ref 3.5–5.1)
Sodium: 135 mEq/L (ref 135–145)

## 2012-08-28 LAB — CBC
Hemoglobin: 12 g/dL (ref 12.0–15.0)
MCV: 85.7 fL (ref 78.0–100.0)
Platelets: 171 10*3/uL (ref 150–400)
RBC: 4.26 MIL/uL (ref 3.87–5.11)
WBC: 10 10*3/uL (ref 4.0–10.5)

## 2012-08-28 MED ORDER — ASPIRIN 81 MG PO TBEC: 81.0000 mg | DELAYED_RELEASE_TABLET | Freq: Every day | ORAL | Status: AC

## 2012-08-28 MED ORDER — LISINOPRIL 20 MG PO TABS: 20.0000 mg | ORAL_TABLET | Freq: Every day | ORAL | Status: AC

## 2012-08-28 MED ORDER — METOPROLOL TARTRATE 25 MG PO TABS: 25.0000 mg | ORAL_TABLET | Freq: Two times a day (BID) | ORAL | Status: DC

## 2012-08-28 MED ORDER — POTASSIUM CHLORIDE CRYS ER 20 MEQ PO TBCR: 20.0000 meq | EXTENDED_RELEASE_TABLET | Freq: Every day | ORAL | Status: AC

## 2012-08-28 MED ORDER — FUROSEMIDE 20 MG PO TABS: 20.0000 mg | ORAL_TABLET | Freq: Two times a day (BID) | ORAL | Status: DC

## 2012-08-28 MED ORDER — AMLODIPINE BESYLATE 5 MG PO TABS: 5.0000 mg | ORAL_TABLET | Freq: Every day | ORAL | Status: DC

## 2012-08-28 NOTE — Discharge Summary (Signed)
Physician Discharge Summary  Jacqueline Kirk ZOX:[id number] DOB: 1970-08-03 DOA: 08/25/2012  PCP: Benita Stabile, MD - New referral  Admit date: 08/25/2012 Discharge date: 08/28/2012  Time spent: 45 minutes  Recommendations for Outpatient Follow-up:  1. Reinforce compliance with blood pressure medications, low-salt diet  Discharge Diagnoses:   Pulmonary edema - Resolved   Hypertension   Acute diastolic congestive heart failure  Discharge Condition: Good  Diet recommendation: Heart healthy  Filed Weights   08/26/12 0607 08/27/12 0532 08/28/12 0545  Weight: 146.6 kg (323 lb 3.1 oz) 144.471 kg (318 lb 8 oz) 144.5 kg (318 lb 9 oz)    History of present illness:  42 year old patient who presented to the hospital with acute respiratory failure. She was found to be in pulmonary edema and was found to have uncontrolled hypertension.  Hospital Course:  Cardiomegaly and pulmonary edema, secondary to acute congestive heart failure, new onset with resultant acute hypoxic respiratory failure which required bipap briefly in the ED.  No evidence of myocardial injury - troponins WNL x3 .  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 She was negative 5599 cc during this admission We did- Continue ACEI and start low dose BB and baby aspirin   Headache  After admission patient started complaining of headaches which we have attributed to initial treatment with nitroglycerin in as well as lowering of the blood pressure. Headaches resolved prior to discharge and patient remained without neurological deficits  HTN,  - started lisinopril at home dose and titrated up to 20 mg daily 4/26. BB added 4/25 and titrated up on 4/26. Norvasc 5 QHS added 4/26  Blood pressure under great control at the time of  discharge Leukocytosis, likely reactive from pulm edema  Resolved by 4/26      Procedures:  Transthoracic echocardiogram (i.e. Studies not automatically included, echos, thoracentesis, etc; not x-rays)  Consultations:  None  Discharge Exam: Filed Vitals:   08/27/12 1030 08/27/12 1408 08/27/12 2039 08/28/12 0545  BP: 105/66 112/77 119/81 129/75  Pulse: 80 80 87 76  Temp:  98 F (36.7 C) 98.5 F (36.9 C) 98.1 F (36.7 C)  TempSrc:  Oral Oral Oral  Resp:  18 20 20   Height:      Weight:    144.5 kg (318 lb 9 oz)  SpO2:  98% 99% 99%    General: Alert and oriented x3 Cardiovascular: Regular rate and rhythm Respiratory: Clear to auscultation bilaterally  Discharge Instructions  Discharge Orders   Future Orders Complete By Expires     (HEART FAILURE PATIENTS) Call MD:  Anytime you have any of the following symptoms: 1) 3 pound weight gain in 24 hours or 5 pounds in 1 week 2) shortness of breath, with or without a dry hacking cough 3) swelling in the hands, feet or stomach 4) if you have to sleep on extra pillows at night in order to breathe.  As directed     Diet - low sodium heart healthy  As directed     Increase activity slowly  As directed         Medication List    TAKE these medications       amLODipine 5 MG tablet  Commonly known as:  NORVASC  Take 1 tablet (5 mg total) by mouth at bedtime.     aspirin 81 MG  EC tablet  Take 1 tablet (81 mg total) by mouth daily.     furosemide 20 MG tablet  Commonly known as:  LASIX  Take 1 tablet (20 mg total) by mouth 2 (two) times daily.     lisinopril 20 MG tablet  Commonly known as:  PRINIVIL,ZESTRIL  Take 1 tablet (20 mg total) by mouth daily.     metoprolol tartrate 25 MG tablet  Commonly known as:  LOPRESSOR  Take 1 tablet (25 mg total) by mouth 2 (two) times daily.     potassium chloride SA 20 MEQ tablet  Commonly known as:  K-DUR,KLOR-CON  Take 1 tablet (20 mEq total) by mouth daily.            Follow-up Information   Follow up with Benita Stabile, MD. (the office will call you)    Contact information:   [facility] [address] [place] [address] [phone]        The results of significant diagnostics from this hospitalization (including imaging, microbiology, ancillary and laboratory) are listed below for reference.    Significant Diagnostic Studies: Ct Angio Chest Pe W/cm &/or Wo Cm  08/25/2012  *RADIOLOGY REPORT*  Clinical Data: Shortness of breath.  CT ANGIOGRAPHY CHEST  Technique:  Multidetector CT imaging of the chest using the standard protocol during bolus administration of intravenous contrast. Multiplanar reconstructed images including MIPs were obtained and reviewed to evaluate the vascular anatomy.  Contrast: 80mL OMNIPAQUE IOHEXOL 350 MG/ML SOLN  Comparison: 08/25/2012 radiograph  Findings: Contrast bolus timing suboptimal.  Allowing for this, no main or central lobar pulmonary arterial branch filling defect. More peripheral branches are inadequately opacified.  Normal caliber aorta.  Cardiomegaly.  No pleural or pericardial effusion.  Limited images through the upper abdomen show no acute finding. Cholecystectomy.  Reactive sized mediastinal and hilar lymph nodes.  Bilateral ground-glass opacities, right greater than left, within the upper and lower lobes.  No pneumothorax.  Central airways are in expiration however patent.  No acute osseous finding.  IMPRESSION: Suboptimal contrast bolus timing.  No main or central lobar pulmonary embolism.  The more peripheral branches are nondiagnostic.  Cardiomegaly.  Bilateral ground-glass opacities may reflect pulmonary edema, infectious or inflammatory pneumonitis.   Original Report Authenticated By: Jearld Lesch, M.D.    Dg Chest Port 1 View  08/25/2012  *RADIOLOGY REPORT*  Clinical Data: Respiratory distress  PORTABLE CHEST - 1 VIEW  Comparison: None.  Findings:  Cardiomegaly.  Mediastinal contours otherwise within normal range.  Mild interstitial prompts.  No confluent airspace opacity.  No pleural effusion or pneumothorax.  No acute osseous finding.  IMPRESSION: Cardiomegaly.  Mild interstitial prominence may be accentuated by portable technique and patient body habitus. Mild interstitial edema not excluded.   Original Report Authenticated By: Jearld Lesch, M.D.     Microbiology: No results found for this or any previous visit (from the past 240 hour(s)).   Labs: Basic Metabolic Panel:  Recent Labs Lab 08/25/12 0332 08/25/12 0956 08/26/12 0640 08/27/12 0644 08/28/12 0600  NA 139  --  138 138 135  K 3.5  --  4.1 3.8 4.2  CL 101  --  104 101 100  CO2 26  --  25 28 27   GLUCOSE 143*  --  103* 100* 139*  BUN 12  --  19 18 24*  CREATININE 0.74 0.68 0.82 0.85 0.99  CALCIUM 9.4  --  9.2 9.1 9.0   Liver Function  Tests:  Recent Labs Lab 08/25/12 0332  AST 14  ALT 6  ALKPHOS 87  BILITOT 0.3  PROT 8.3  ALBUMIN 3.6   No results found for this basename: LIPASE, AMYLASE,  in the last 168 hours No results found for this basename: AMMONIA,  in the last 168 hours CBC:  Recent Labs Lab 08/25/12 0332 08/25/12 0956 08/26/12 0640 08/27/12 0644 08/28/12 0600  WBC 13.1* 11.8* 8.8 8.1 10.0  NEUTROABS 10.4*  --   --   --   --   HGB 12.1 10.9* 11.6* 11.9* 12.0  HCT 37.3 33.4* 36.0 36.7 36.5  MCV 85.4 85.2 85.7 85.3 85.7  PLT 187 174 182 181 171   Cardiac Enzymes:  Recent Labs Lab 08/25/12 0956 08/25/12 1530 08/25/12 2323  TROPONINI <0.30 <0.30 <0.30   BNP: BNP (last 3 results)  Recent Labs  08/25/12 0332  PROBNP 555.2*   CBG: No results found for this basename: GLUCAP,  in the last 168 hours     Signed:  Jabbar Palmero  [facility] 08/28/2012, 11:39 AM

## 2012-08-28 NOTE — Progress Notes (Signed)
All d/c instructions explained as given to pt.  Verbalized understanding.  D/C off floor via w/c & family accompanied.  Amanda Pea, Charity fundraiser.

## 2012-12-16 ENCOUNTER — Encounter (HOSPITAL_COMMUNITY): Payer: Self-pay | Admitting: *Deleted

## 2012-12-16 ENCOUNTER — Emergency Department (HOSPITAL_COMMUNITY)
Admission: EM | Admit: 2012-12-16 | Discharge: 2012-12-16 | Disposition: A | Discharge status: Home / Self Care | Payer: BC Managed Care – PPO | Attending: Emergency Medicine | Admitting: Emergency Medicine

## 2012-12-16 DIAGNOSIS — I10 Essential (primary) hypertension: Secondary | ICD-10-CM | POA: Insufficient documentation

## 2012-12-16 DIAGNOSIS — Z872 Personal history of diseases of the skin and subcutaneous tissue: Secondary | ICD-10-CM | POA: Insufficient documentation

## 2012-12-16 DIAGNOSIS — E669 Obesity, unspecified: Secondary | ICD-10-CM | POA: Insufficient documentation

## 2012-12-16 DIAGNOSIS — R110 Nausea: Secondary | ICD-10-CM | POA: Insufficient documentation

## 2012-12-16 DIAGNOSIS — R350 Frequency of micturition: Secondary | ICD-10-CM | POA: Insufficient documentation

## 2012-12-16 DIAGNOSIS — Z7982 Long term (current) use of aspirin: Secondary | ICD-10-CM | POA: Insufficient documentation

## 2012-12-16 DIAGNOSIS — M549 Dorsalgia, unspecified: Secondary | ICD-10-CM | POA: Insufficient documentation

## 2012-12-16 DIAGNOSIS — Z79899 Other long term (current) drug therapy: Secondary | ICD-10-CM | POA: Insufficient documentation

## 2012-12-16 DIAGNOSIS — Z86711 Personal history of pulmonary embolism: Secondary | ICD-10-CM | POA: Insufficient documentation

## 2012-12-16 DIAGNOSIS — R300 Dysuria: Secondary | ICD-10-CM | POA: Insufficient documentation

## 2012-12-16 DIAGNOSIS — R3915 Urgency of urination: Secondary | ICD-10-CM | POA: Insufficient documentation

## 2012-12-16 DIAGNOSIS — I509 Heart failure, unspecified: Secondary | ICD-10-CM | POA: Insufficient documentation

## 2012-12-16 DIAGNOSIS — Z862 Personal history of diseases of the blood and blood-forming organs and certain disorders involving the immune mechanism: Secondary | ICD-10-CM | POA: Insufficient documentation

## 2012-12-16 DIAGNOSIS — N12 Tubulo-interstitial nephritis, not specified as acute or chronic: Secondary | ICD-10-CM

## 2012-12-16 DIAGNOSIS — Z8719 Personal history of other diseases of the digestive system: Secondary | ICD-10-CM | POA: Insufficient documentation

## 2012-12-16 LAB — URINALYSIS, ROUTINE W REFLEX MICROSCOPIC
Bilirubin Urine: NEGATIVE
Glucose, UA: NEGATIVE mg/dL
Nitrite: NEGATIVE
Specific Gravity, Urine: 1.008 (ref 1.005–1.030)
pH: 6.5 (ref 5.0–8.0)

## 2012-12-16 LAB — LIPASE, BLOOD: Lipase: 16 U/L (ref 11–59)

## 2012-12-16 LAB — COMPREHENSIVE METABOLIC PANEL
Alkaline Phosphatase: 76 U/L (ref 39–117)
BUN: 9 mg/dL (ref 6–23)
CO2: 29 mEq/L (ref 19–32)
Chloride: 101 mEq/L (ref 96–112)
GFR calc Af Amer: >90 mL/min (ref >90)
GFR calc non Af Amer: 83 mL/min — ABNORMAL LOW (ref >90)
Glucose, Bld: 99 mg/dL (ref 70–99)
Potassium: 4 mEq/L (ref 3.5–5.1)
Total Bilirubin: 0.3 mg/dL (ref 0.3–1.2)

## 2012-12-16 LAB — CBC WITH DIFFERENTIAL/PLATELET
HCT: 34.3 % — ABNORMAL LOW (ref 36.0–46.0)
Hemoglobin: 11.3 g/dL — ABNORMAL LOW (ref 12.0–15.0)
Lymphs Abs: 2.6 10*3/uL (ref 0.7–4.0)
Monocytes Relative: 6 % (ref 3–12)
Neutro Abs: 8.8 10*3/uL — ABNORMAL HIGH (ref 1.7–7.7)
Neutrophils Relative %: 72 % (ref 43–77)
RBC: 3.94 MIL/uL (ref 3.87–5.11)

## 2012-12-16 MED ORDER — HYDROCODONE-ACETAMINOPHEN 5-325 MG PO TABS: 1.0000 | ORAL_TABLET | ORAL | Status: DC | PRN

## 2012-12-16 MED ORDER — ONDANSETRON HCL 4 MG PO TABS: 4.0000 mg | ORAL_TABLET | Freq: Four times a day (QID) | ORAL | Status: DC

## 2012-12-16 MED ORDER — ONDANSETRON HCL 4 MG/2ML IJ SOLN
4.0000 mg | Freq: Once | INTRAMUSCULAR | Status: AC
  Administered 2012-12-16: 4 mg via INTRAVENOUS
  Filled 2012-12-16: qty 2

## 2012-12-16 MED ORDER — CIPROFLOXACIN HCL 500 MG PO TABS: 500.0000 mg | ORAL_TABLET | Freq: Two times a day (BID) | ORAL | Status: DC

## 2012-12-16 MED ORDER — MORPHINE SULFATE 4 MG/ML IJ SOLN
4.0000 mg | Freq: Once | INTRAMUSCULAR | Status: AC
  Administered 2012-12-16: 4 mg via INTRAVENOUS
  Filled 2012-12-16: qty 1

## 2012-12-16 MED ORDER — FLUCONAZOLE 150 MG PO TABS: 150.0000 mg | ORAL_TABLET | Freq: Every day | ORAL | Status: AC

## 2012-12-16 MED ORDER — CIPROFLOXACIN HCL 500 MG PO TABS
500.0000 mg | ORAL_TABLET | Freq: Once | ORAL | Status: AC
  Administered 2012-12-16: 500 mg via ORAL
  Filled 2012-12-16: qty 1

## 2012-12-16 NOTE — ED Notes (Signed)
Discharge delayed, patient request for yeast infection medication. States everytime she takes antibiotics she gets one and wants to already have it. Notified PA.

## 2012-12-16 NOTE — ED Provider Notes (Signed)
CSN: [id number]     Arrival date & time 12/16/12  1817 History     First MD Initiated Contact with Patient 12/16/12 1846     Chief Complaint  Patient presents with  . Abdominal Pain   (Consider location/radiation/quality/duration/timing/severity/associated sxs/prior Treatment) HPI Comments: Patient is a 42 year old female past history significant for hypertension, obesity, CHF, anemia, status post cholecystectomy and presented to the emergency department for acute onset generalized abdominal pain with radiation to back that began this afternoon. Patient is unable to localizer her pain stating "it hurts all over." Patient rates her pain 10 out of 10 with no alleviating or aggravating factors. Patient does have associated dysuria, frequency, urgency for a few days prior to the onset of her abdominal pain. Patient also endorses nausea.   Patient is a 42 y.o. female presenting with abdominal pain.  Abdominal Pain Associated symptoms: dysuria and nausea   Associated symptoms: no chest pain, no chills, no constipation, no diarrhea, no fever, no shortness of breath and no vomiting     Past Medical History  Diagnosis Date  . Hypertension   . Obesity   . Hives   . Gallstones     s/p cholecystectomy  . Preeclampsia   . PONV (postoperative nausea and vomiting)   . Pulmonary embolism 2005  . CHF (congestive heart failure)     "dx'd 08/24/2012" (08/25/2012)  . Exertional shortness of breath     "walking" (08/25/2012)  . Anemia     "long time ago" (08/25/2012)  . Headache(784.0)     "weekly; last month or so" (08/25/2012)   Past Surgical History  Procedure Laterality Date  . Cholecystectomy  1996  . Hysteroscopy w/ endometrial ablation  ~ 2009  . Dilation and curettage of uterus  2004  . Skin biopsy  2000's    "to see why I was breaking out" (08/25/2012)   Family History  Problem Relation Age of Onset  . Stroke Brother   . Diabetes Brother   . Cirrhosis      multiple family members  .  Alcoholism      multiple family members   History  Substance Use Topics  . Smoking status: Never Smoker   . Smokeless tobacco: Never Used  . Alcohol Use: No   OB History   Grav Para Term Preterm Abortions TAB SAB Ect Mult Living                 Review of Systems  Constitutional: Negative for fever and chills.  HENT: Negative.   Eyes: Negative.   Respiratory: Negative for shortness of breath.   Cardiovascular: Negative for chest pain.  Gastrointestinal: Positive for nausea and abdominal pain. Negative for vomiting, diarrhea and constipation.  Genitourinary: Positive for dysuria, urgency and flank pain.  Musculoskeletal: Positive for back pain.  Skin: Negative.   Neurological: Negative.     Allergies  Review of patient's allergies indicates no known allergies.  Home Medications   Current Outpatient Rx  Name  Route  Sig  Dispense  Refill  . amLODipine (NORVASC) 5 MG tablet   Oral   Take 5 mg by mouth daily.         Marland Kitchen aspirin EC 81 MG EC tablet   Oral   Take 1 tablet (81 mg total) by mouth daily.         . furosemide (LASIX) 20 MG tablet   Oral   Take 20 mg by mouth daily.         Marland Kitchen  lisinopril (PRINIVIL,ZESTRIL) 20 MG tablet   Oral   Take 1 tablet (20 mg total) by mouth daily.   30 tablet   0   . metoprolol succinate (TOPROL-XL) 25 MG 24 hr tablet   Oral   Take 25 mg by mouth daily.         . potassium chloride SA (K-DUR,KLOR-CON) 20 MEQ tablet   Oral   Take 1 tablet (20 mEq total) by mouth daily.   30 tablet   0   . ciprofloxacin (CIPRO) 500 MG tablet   Oral   Take 1 tablet (500 mg total) by mouth every 12 (twelve) hours.   20 tablet   0   . HYDROcodone-acetaminophen (NORCO/VICODIN) 5-325 MG per tablet   Oral   Take 1 tablet by mouth every 4 (four) hours as needed for pain.   12 tablet   0   . ondansetron (ZOFRAN) 4 MG tablet   Oral   Take 1 tablet (4 mg total) by mouth every 6 (six) hours.   12 tablet   0    BP 165/105  Pulse 87   Temp(Src) 98.5 F (36.9 C) (Oral)  Resp 16  SpO2 100% Physical Exam  Constitutional: She is oriented to person, place, and time. She appears well-developed and well-nourished. No distress.  HENT:  Head: Normocephalic and atraumatic.  Right Ear: External ear normal.  Left Ear: External ear normal.  Nose: Nose normal.  Mouth/Throat: Oropharynx is clear and moist.  Eyes: Conjunctivae are normal.  Neck: Normal range of motion. Neck supple.  Cardiovascular: Normal rate, regular rhythm, normal heart sounds and intact distal pulses.   Pulmonary/Chest: Effort normal and breath sounds normal. No respiratory distress.  Abdominal: Soft. Bowel sounds are normal. She exhibits no distension and no mass. There is generalized tenderness. There is CVA tenderness. There is no rigidity, no rebound and no guarding.  R CVA tenderness  Musculoskeletal: Normal range of motion.  Neurological: She is alert and oriented to person, place, and time.  Skin: Skin is warm and dry. No rash noted. She is not diaphoretic.    ED Course   Procedures (including critical care time)  Medications  morphine 4 MG/ML injection 4 mg (not administered)  ondansetron (ZOFRAN) injection 4 mg (not administered)  ciprofloxacin (CIPRO) tablet 500 mg (not administered)  morphine 4 MG/ML injection 4 mg (4 mg Intravenous Given 12/16/12 1945)  ondansetron (ZOFRAN) injection 4 mg (4 mg Intravenous Given 12/16/12 1945)     Labs Reviewed  URINALYSIS, ROUTINE W REFLEX MICROSCOPIC - Abnormal; Notable for the following:    Protein, ur 30 (*)    Leukocytes, UA SMALL (*)    All other components within normal limits  CBC WITH DIFFERENTIAL - Abnormal; Notable for the following:    WBC 12.3 (*)    Hemoglobin 11.3 (*)    HCT 34.3 (*)    Neutro Abs 8.8 (*)    All other components within normal limits  COMPREHENSIVE METABOLIC PANEL - Abnormal; Notable for the following:    Albumin 3.1 (*)    GFR calc non Af Amer 83 (*)    All other  components within normal limits  LIPASE, BLOOD  URINE MICROSCOPIC-ADD ON   No results found. 1. Pyelonephritis     MDM  Patient with abdominal pain and CVA tenderness with urinary symptoms. Patient nontoxic nonseptic appearing. Nonacute abdomen on examination. Physical exam findings, history, UA results consistent clinically with uncomplicated pyelonephritis as patient is afebrile and there is  no intractable vomiting. Pain symptoms improved and ED with pain medication. Patient noted to be hypertensive in emergency department patient does have history of hypertension classes discussed this with her PCP regarding her hypertensive agents. ON re-examination abdomen remains nonacute. She'll be discharged home on antibiotics and pain medication. Advised to follow up with PCP. Return precautions discussed. Discussed patient case with Dr. Fayrene Fearing who agrees with my plan for patient. Patient stable at time of discharge.  Jeannetta Ellis, PA-C 12/16/12 2050

## 2012-12-16 NOTE — ED Provider Notes (Signed)
The patient has a benign abdomen. Primarily back pain. Dysreflexia 3 days. Urine appears infected. As elevated, blood cell count. Plan IV antibiotics pain control home pain meds antibiotics Dx:  UTI  Medical screening examination/treatment/procedure(s) were conducted as a shared visit with non-physician practitioner(s) and myself.  I personally evaluated the patient during the encounter   Claudean Kinds, MD 12/16/12 [phone]

## 2012-12-16 NOTE — ED Notes (Signed)
The pt has had abd and back pain with urinary frequency for 2-3 days.  lmp none abalation

## 2012-12-19 NOTE — ED Provider Notes (Signed)
Medical screening examination/treatment/procedure(s) were performed by non-physician practitioner and as supervising physician I was immediately available for consultation/collaboration.   Claudean Kinds, MD 12/19/12 [phone]

## 2013-01-09 ENCOUNTER — Encounter (HOSPITAL_COMMUNITY): Payer: Self-pay | Admitting: *Deleted

## 2013-01-09 ENCOUNTER — Emergency Department (HOSPITAL_COMMUNITY)
Admission: EM | Admit: 2013-01-09 | Discharge: 2013-01-10 | Disposition: A | Discharge status: Home / Self Care | Payer: BC Managed Care – PPO | Attending: Emergency Medicine | Admitting: Emergency Medicine

## 2013-01-09 ENCOUNTER — Emergency Department (HOSPITAL_COMMUNITY): Payer: BC Managed Care – PPO

## 2013-01-09 DIAGNOSIS — E669 Obesity, unspecified: Secondary | ICD-10-CM | POA: Insufficient documentation

## 2013-01-09 DIAGNOSIS — Z86711 Personal history of pulmonary embolism: Secondary | ICD-10-CM | POA: Insufficient documentation

## 2013-01-09 DIAGNOSIS — I10 Essential (primary) hypertension: Secondary | ICD-10-CM | POA: Insufficient documentation

## 2013-01-09 DIAGNOSIS — R350 Frequency of micturition: Secondary | ICD-10-CM | POA: Insufficient documentation

## 2013-01-09 DIAGNOSIS — Z872 Personal history of diseases of the skin and subcutaneous tissue: Secondary | ICD-10-CM | POA: Insufficient documentation

## 2013-01-09 DIAGNOSIS — R0789 Other chest pain: Secondary | ICD-10-CM

## 2013-01-09 DIAGNOSIS — Z8719 Personal history of other diseases of the digestive system: Secondary | ICD-10-CM | POA: Insufficient documentation

## 2013-01-09 DIAGNOSIS — Z862 Personal history of diseases of the blood and blood-forming organs and certain disorders involving the immune mechanism: Secondary | ICD-10-CM | POA: Insufficient documentation

## 2013-01-09 DIAGNOSIS — Z7982 Long term (current) use of aspirin: Secondary | ICD-10-CM | POA: Insufficient documentation

## 2013-01-09 DIAGNOSIS — Z79899 Other long term (current) drug therapy: Secondary | ICD-10-CM | POA: Insufficient documentation

## 2013-01-09 DIAGNOSIS — R059 Cough, unspecified: Secondary | ICD-10-CM | POA: Insufficient documentation

## 2013-01-09 DIAGNOSIS — R51 Headache: Secondary | ICD-10-CM | POA: Insufficient documentation

## 2013-01-09 DIAGNOSIS — R110 Nausea: Secondary | ICD-10-CM | POA: Insufficient documentation

## 2013-01-09 DIAGNOSIS — I509 Heart failure, unspecified: Secondary | ICD-10-CM | POA: Insufficient documentation

## 2013-01-09 DIAGNOSIS — R05 Cough: Secondary | ICD-10-CM | POA: Insufficient documentation

## 2013-01-09 LAB — POCT I-STAT TROPONIN I: Troponin i, poc: 0.01 ng/mL (ref 0.00–0.08)

## 2013-01-09 LAB — POCT I-STAT, CHEM 8
Creatinine, Ser: 1.1 mg/dL (ref 0.50–1.10)
HCT: 37 % (ref 36.0–46.0)
Hemoglobin: 12.6 g/dL (ref 12.0–15.0)
Potassium: 3.8 mEq/L (ref 3.5–5.1)
Sodium: 141 mEq/L (ref 135–145)
TCO2: 26 mmol/L (ref 0–100)

## 2013-01-09 MED ORDER — PROCHLORPERAZINE EDISYLATE 5 MG/ML IJ SOLN
10.0000 mg | Freq: Once | INTRAMUSCULAR | Status: AC
  Administered 2013-01-09: 10 mg via INTRAVENOUS
  Filled 2013-01-09: qty 2

## 2013-01-09 MED ORDER — KETOROLAC TROMETHAMINE 30 MG/ML IJ SOLN
30.0000 mg | Freq: Once | INTRAMUSCULAR | Status: AC
  Administered 2013-01-09: 30 mg via INTRAVENOUS
  Filled 2013-01-09: qty 1

## 2013-01-09 MED ORDER — DIPHENHYDRAMINE HCL 50 MG/ML IJ SOLN
25.0000 mg | Freq: Once | INTRAMUSCULAR | Status: AC
  Administered 2013-01-09: 25 mg via INTRAVENOUS
  Filled 2013-01-09: qty 1

## 2013-01-09 MED ORDER — SODIUM CHLORIDE 0.9 % IV SOLN
Freq: Once | INTRAVENOUS | Status: AC
  Administered 2013-01-09: 22:00:00 via INTRAVENOUS

## 2013-01-09 NOTE — ED Notes (Signed)
Pt also reports she fell last Thursday and is c/o left lower leg pain

## 2013-01-09 NOTE — ED Notes (Signed)
C/o headache since Sunday and chest pain "fluttering" that started today.

## 2013-01-09 NOTE — ED Provider Notes (Signed)
CSN: [id number]     Arrival date & time 01/09/13  1914 History   First MD Initiated Contact with Patient 01/09/13 2105     Chief Complaint  Patient presents with  . Headache  . Chest Pain   (Consider location/radiation/quality/duration/timing/severity/associated sxs/prior Treatment) HPI Comments: Patient presents with frontal and occipital headache x 3 days, not relieved with ibuprofen.  Pain began gradually and started off mild, has been like a typical headache for her but lasting longer.  Pain is causing her left eye to hurt which is also typical of her headaches.  Has had nausea but no vomiting.  Denies visual changes. Denies focal neurological deficits.  Denies head trauma, fever,chills, neck pain or stiffness.  This is not the "worst" headache of her life.   States that yesterday she became very sensitive to smells and has felt that it was hard to breathe when she was around smoke or perfume.  Has had fluttering in her chest - like her heart beating too fast - since yesterday as well and has had tightness in her chest that has been constant since this morning.  States she thinks it might be her CHF.  Denies any recent weight gain.  Denies cough.   Patient is a 42 y.o. female presenting with headaches and chest pain. The history is provided by the patient.  Headache Associated symptoms: cough   Associated symptoms: no abdominal pain, no diarrhea, no fever, no nausea, no numbness and no vomiting   Chest Pain Associated symptoms: cough, headache and shortness of breath   Associated symptoms: no abdominal pain, no fever, no nausea, no numbness, not vomiting and no weakness     Past Medical History  Diagnosis Date  . Hypertension   . Obesity   . Hives   . Gallstones     s/p cholecystectomy  . Preeclampsia   . PONV (postoperative nausea and vomiting)   . Pulmonary embolism 2005  . CHF (congestive heart failure)     "dx'd 08/24/2012" (08/25/2012)  . Exertional shortness of breath    "walking" (08/25/2012)  . Anemia     "long time ago" (08/25/2012)  . Headache(784.0)     "weekly; last month or so" (08/25/2012)   Past Surgical History  Procedure Laterality Date  . Cholecystectomy  1996  . Hysteroscopy w/ endometrial ablation  ~ 2009  . Dilation and curettage of uterus  2004  . Skin biopsy  2000's    "to see why I was breaking out" (08/25/2012)   Family History  Problem Relation Age of Onset  . Stroke Brother   . Diabetes Brother   . Cirrhosis      multiple family members  . Alcoholism      multiple family members   History  Substance Use Topics  . Smoking status: Never Smoker   . Smokeless tobacco: Never Used  . Alcohol Use: No   OB History   Grav Para Term Preterm Abortions TAB SAB Ect Mult Living                 Review of Systems  Constitutional: Negative for fever and chills.  Respiratory: Positive for cough and shortness of breath.   Cardiovascular: Positive for chest pain.  Gastrointestinal: Negative for nausea, vomiting, abdominal pain and diarrhea.  Genitourinary: Positive for frequency (she states due to her medications (lasix)). Negative for dysuria, urgency, vaginal bleeding and vaginal discharge.  Neurological: Positive for headaches. Negative for weakness and numbness.    Allergies  Review of patient's allergies indicates no known allergies.  Home Medications   Current Outpatient Rx  Name  Route  Sig  Dispense  Refill  . amLODipine (NORVASC) 5 MG tablet   Oral   Take 5 mg by mouth daily.         Marland Kitchen aspirin EC 81 MG EC tablet   Oral   Take 1 tablet (81 mg total) by mouth daily.         . furosemide (LASIX) 20 MG tablet   Oral   Take 20 mg by mouth 2 (two) times daily.          Marland Kitchen lisinopril (PRINIVIL,ZESTRIL) 20 MG tablet   Oral   Take 1 tablet (20 mg total) by mouth daily.   30 tablet   0   . metoprolol tartrate (LOPRESSOR) 25 MG tablet   Oral   Take 25 mg by mouth 2 (two) times daily.         . potassium  chloride SA (K-DUR,KLOR-CON) 20 MEQ tablet   Oral   Take 1 tablet (20 mEq total) by mouth daily.   30 tablet   0    BP 142/81  Pulse 60  Temp(Src) 98.2 F (36.8 C) (Oral)  Resp 19  Ht 4\' 11"  (1.499 m)  Wt 315 lb (142.883 kg)  BMI 63.59 kg/m2  SpO2 100% Physical Exam  Nursing note and vitals reviewed. Constitutional: She appears well-developed and well-nourished. No distress.  HENT:  Head: Normocephalic and atraumatic.  Eyes: Conjunctivae are normal.  Neck: Neck supple.  Cardiovascular: Normal rate and regular rhythm.   Pulmonary/Chest: Effort normal and breath sounds normal. No respiratory distress. She has no wheezes. She has no rales.  Abdominal: Soft. She exhibits no distension. There is no tenderness. There is no rebound and no guarding.  Neurological: She is alert.  CN II-XII intact, EOMs intact, no pronator drift, grip strengths equal bilaterally; strength 5/5 in all extremities, sensation intact in all extremities; finger to nose, heel to shin, rapid alternating movements normal; gait is normal.     Skin: She is not diaphoretic.    ED Course  Procedures (including critical care time) Labs Review Labs Reviewed  POCT I-STAT, CHEM 8  POCT I-STAT TROPONIN I   Imaging Review Dg Chest 2 View  01/09/2013   *RADIOLOGY REPORT*  Clinical Data: Headache and chest pain.  History of hypertension, CHF.  CHEST - 2 VIEW  Comparison: 08/25/2012  Findings: Heart is enlarged.  Lungs are clear.  No pulmonary edema. Mild thoracic and lumbar degenerative changes are present. Surgical clips are present in the upper abdomen.  IMPRESSION: Cardiomegaly.   Original Report Authenticated By: Norva Pavlov, M.D.   11:31 PM Patient reports complete relief of all pain.  States the chest pain went away with the headache.  Ready for d/c.     Date: 01/10/2013  Rate: 52  Rhythm: sinus bradycardia  QRS Axis: normal  Intervals: normal  ST/T Wave abnormalities: nonspecific T wave changes   Conduction Disutrbances:none  Narrative Interpretation:   Old EKG Reviewed: unchanged, though was previously normal sinus rhythm     MDM   1. Headache   2. Chest tightness     Pt with headache x 3 days.  No red flags.  Migraine cocktail given with complete relief.   Pt also with palitations, occasional SOB and constant chest tightness all day.  CXR shows only cardiolmegaly.  Troponin negative.  EKG unremarkable and unchanged. 100% room air.  Pain has been constant all day with negative troponin.    I doubt any other EMC precluding discharge at this time including, but not necessarily limited to the following: ACS, PE, CHF exacerbation, intracranial hemorrhage, meningitis.    [place], PA-C 01/10/13 0013

## 2013-01-10 LAB — POCT I-STAT TROPONIN I: Troponin i, poc: 0 ng/mL (ref 0.00–0.08)

## 2013-01-11 NOTE — ED Provider Notes (Signed)
Medical screening examination/treatment/procedure(s) were performed by non-physician practitioner and as supervising physician I was immediately available for consultation/collaboration.  Shaune Westfall Y. Nefi Musich, MD 01/11/13 2219 

## 2013-07-17 ENCOUNTER — Emergency Department (HOSPITAL_COMMUNITY)
Admission: EM | Admit: 2013-07-17 | Discharge: 2013-07-18 | Disposition: A | Discharge status: Home / Self Care | Payer: BC Managed Care – PPO | Attending: Emergency Medicine | Admitting: Emergency Medicine

## 2013-07-17 DIAGNOSIS — Z203 Contact with and (suspected) exposure to rabies: Secondary | ICD-10-CM

## 2013-07-17 DIAGNOSIS — Z8719 Personal history of other diseases of the digestive system: Secondary | ICD-10-CM | POA: Insufficient documentation

## 2013-07-17 DIAGNOSIS — Z23 Encounter for immunization: Secondary | ICD-10-CM | POA: Insufficient documentation

## 2013-07-17 DIAGNOSIS — I10 Essential (primary) hypertension: Secondary | ICD-10-CM | POA: Insufficient documentation

## 2013-07-17 DIAGNOSIS — I509 Heart failure, unspecified: Secondary | ICD-10-CM | POA: Insufficient documentation

## 2013-07-17 DIAGNOSIS — E669 Obesity, unspecified: Secondary | ICD-10-CM | POA: Insufficient documentation

## 2013-07-17 DIAGNOSIS — Z79899 Other long term (current) drug therapy: Secondary | ICD-10-CM | POA: Insufficient documentation

## 2013-07-17 DIAGNOSIS — Z862 Personal history of diseases of the blood and blood-forming organs and certain disorders involving the immune mechanism: Secondary | ICD-10-CM | POA: Insufficient documentation

## 2013-07-17 DIAGNOSIS — Z7982 Long term (current) use of aspirin: Secondary | ICD-10-CM | POA: Insufficient documentation

## 2013-07-17 DIAGNOSIS — Z872 Personal history of diseases of the skin and subcutaneous tissue: Secondary | ICD-10-CM | POA: Insufficient documentation

## 2013-07-17 DIAGNOSIS — Z86711 Personal history of pulmonary embolism: Secondary | ICD-10-CM | POA: Insufficient documentation

## 2013-07-17 DIAGNOSIS — J069 Acute upper respiratory infection, unspecified: Secondary | ICD-10-CM

## 2013-07-18 ENCOUNTER — Encounter (HOSPITAL_COMMUNITY): Payer: Self-pay | Admitting: Emergency Medicine

## 2013-07-18 MED ORDER — SALINE SPRAY 0.65 % NA SOLN: 1.0000 | NASAL | Status: AC | PRN

## 2013-07-18 MED ORDER — ACETAMINOPHEN 325 MG PO TABS
650.0000 mg | ORAL_TABLET | Freq: Once | ORAL | Status: AC
  Administered 2013-07-18: 650 mg via ORAL
  Filled 2013-07-18: qty 2

## 2013-07-18 MED ORDER — RABIES IMMUNE GLOBULIN 150 UNIT/ML IM INJ
20.0000 [IU]/kg | INJECTION | Freq: Once | INTRAMUSCULAR | Status: AC
  Administered 2013-07-18: 3000 [IU]
  Filled 2013-07-18: qty 20

## 2013-07-18 MED ORDER — RABIES VACCINE, PCEC IM SUSR
1.0000 mL | Freq: Once | INTRAMUSCULAR | Status: AC
  Administered 2013-07-18: 1 mL via INTRAMUSCULAR
  Filled 2013-07-18: qty 1

## 2013-07-18 NOTE — ED Provider Notes (Signed)
Medical screening examination/treatment/procedure(s) were performed by non-physician practitioner and as supervising physician I was immediately available for consultation/collaboration.   EKG Interpretation None       Lewie Deman M Jessamy Torosyan, MD 07/18/13 1604 

## 2013-07-18 NOTE — ED Notes (Signed)
Pt had 2 bats in her house;  Animal control recommended they come here because they were unable to catch the bats.

## 2013-07-18 NOTE — ED Provider Notes (Signed)
CSN: [id number]     Arrival date & time 07/17/13  2346 History   None    Chief Complaint  Patient presents with  . Rabies Injection     (Consider location/radiation/quality/duration/timing/severity/associated sxs/prior Treatment) HPI Comments: Patient is a 43 year old female past medical history significant for hypertension, obesity, history of PE, CHF, anemia, headaches presenting to the ED after a bat exposure this evening. The patient denies any bites. Patient is also complaining about two days of sinus pressure, rhinorrhea, and post nasal drip. Patient has not tried any over-the-counter medications for her symptoms. She denies any alleviating or aggravating factors. Denies any fevers, chills, nausea, vomiting, abdominal pain, CP, cough.    Past Medical History  Diagnosis Date  . Hypertension   . Obesity   . Hives   . Gallstones     s/p cholecystectomy  . Preeclampsia   . PONV (postoperative nausea and vomiting)   . Pulmonary embolism 2005  . CHF (congestive heart failure)     "dx'd 08/24/2012" (08/25/2012)  . Exertional shortness of breath     "walking" (08/25/2012)  . Anemia     "long time ago" (08/25/2012)  . Headache(784.0)     "weekly; last month or so" (08/25/2012)   Past Surgical History  Procedure Laterality Date  . Cholecystectomy  1996  . Hysteroscopy w/ endometrial ablation  ~ 2009  . Dilation and curettage of uterus  2004  . Skin biopsy  2000's    "to see why I was breaking out" (08/25/2012)   Family History  Problem Relation Age of Onset  . Stroke Brother   . Diabetes Brother   . Cirrhosis      multiple family members  . Alcoholism      multiple family members   History  Substance Use Topics  . Smoking status: Never Smoker   . Smokeless tobacco: Never Used  . Alcohol Use: No   OB History   Grav Para Term Preterm Abortions TAB SAB Ect Mult Living                 Review of Systems  Constitutional: Negative for fever and chills.  HENT: Positive for  rhinorrhea and sinus pressure.   Respiratory: Negative for cough and shortness of breath.   Skin: Negative for wound.  All other systems reviewed and are negative.      Allergies  Review of patient's allergies indicates no known allergies.  Home Medications   Current Outpatient Rx  Name  Route  Sig  Dispense  Refill  . amLODipine (NORVASC) 5 MG tablet   Oral   Take 5 mg by mouth daily.         Marland Kitchen. aspirin EC 81 MG EC tablet   Oral   Take 1 tablet (81 mg total) by mouth daily.         . furosemide (LASIX) 20 MG tablet   Oral   Take 20 mg by mouth 2 (two) times daily.          Marland Kitchen. lisinopril (PRINIVIL,ZESTRIL) 20 MG tablet   Oral   Take 1 tablet (20 mg total) by mouth daily.   30 tablet   0   . metoprolol tartrate (LOPRESSOR) 25 MG tablet   Oral   Take 25 mg by mouth 2 (two) times daily.         . potassium chloride SA (K-DUR,KLOR-CON) 20 MEQ tablet   Oral   Take 1 tablet (20 mEq total) by mouth daily.  30 tablet   0   . sodium chloride (OCEAN) 0.65 % SOLN nasal spray   Each Nare   Place 1 spray into both nostrils as needed for congestion.   60 mL   0    BP 167/103  Pulse 78  Temp(Src) 98.1 F (36.7 C) (Oral)  Resp 20  Wt 329 lb 12.9 oz (149.6 kg)  SpO2 100% Physical Exam  Nursing note and vitals reviewed. Constitutional: She is oriented to person, place, and time. She appears well-developed and well-nourished. No distress.  HENT:  Head: Normocephalic and atraumatic.  Right Ear: External ear normal.  Left Ear: External ear normal.  Nose: Rhinorrhea present. Right sinus exhibits no maxillary sinus tenderness and no frontal sinus tenderness. Left sinus exhibits no maxillary sinus tenderness and no frontal sinus tenderness.  Mouth/Throat: Uvula is midline, oropharynx is clear and moist and mucous membranes are normal. No oropharyngeal exudate.  Eyes: Conjunctivae are normal.  Neck: Normal range of motion. Neck supple.  Cardiovascular: Normal rate,  regular rhythm and normal heart sounds.   Pulmonary/Chest: Effort normal and breath sounds normal.  Abdominal: Soft.  Musculoskeletal: Normal range of motion.  Neurological: She is alert and oriented to person, place, and time.  Skin: Skin is warm and dry. She is not diaphoretic.  Psychiatric: She has a normal mood and affect.    ED Course  Procedures (including critical care time) Medications  rabies vaccine (RABAVERT) injection 1 mL (1 mL Intramuscular Given 07/18/13 0125)  rabies immune globulin (HYPERAB) injection 3,000 Units (3,000 Units Infiltration Given 07/18/13 0126)  acetaminophen (TYLENOL) tablet 650 mg (650 mg Oral Given 07/18/13 0145)    Labs Review Labs Reviewed - No data to display Imaging Review No results found.   EKG Interpretation None      MDM   Final diagnoses:  Need for post exposure prophylaxis for rabies  URI (upper respiratory infection)    Filed Vitals:   07/18/13 0010  BP: 167/103  Pulse: 78  Temp: 98.1 F (36.7 C)  Resp: 20   Afebrile, NAD, non-toxic appearing, AAOx4.   1) Rabies: Patient presenting to the emergency department after that exposure. No evidence of wounds. No physical complaints. Immunoglobulin and first dose of vaccination will be given in emergency department. Discussed vaccination schedule with the patient.Marland Kitchen   2) URI: Patients symptoms are consistent with URI, likely viral etiology. Discussed that antibiotics are not indicated for viral infections. Pt will be discharged with symptomatic treatment.  Verbalizes understanding and is agreeable with plan.   Pt is hemodynamically stable & in NAD prior to dc.  Return precautions discussed. Advised patient to start to take his blood pressure medication as prescribed. Patient d/w with Dr. Carolyne Littles, agrees with plan.    Jeannetta Ellis, PA-C 07/18/13 [phone]

## 2013-07-18 NOTE — Discharge Instructions (Signed)
Please follow up with your primary care physician in 1-2 days. If you do not have one please call the [facility] number listed above. Please read all discharge instructions and return precautions.      Rabies  Rabies is a viral infection that can be spread to people from infected animals. The infection affects the brain and central nervous system. Once the disease develops, it almost always causes death. Because of this, when a person is bitten by an animal that may have rabies, treatment to prevent rabies often needs to be started whether or not the animal is known to be infected. Prompt treatment with the rabies vaccine and rabies immune globulin is very effective at preventing the infection from developing in people who have been exposed to the rabies virus. CAUSES  Rabies is caused by a virus that lives inside some animals. When a person is bitten by an infected animal, the rabies virus is spread to the person through the infected spit (saliva) of the animal. This virus can be carried by animals such as dogs, cats, skunks, bats, woodchucks, raccoons, coyotes, and foxes. SYMPTOMS  By the time symptoms appear, rabies is usually fatal for the person. Common symptoms include:  Headache.  Fever.  Fatigue and weakness.  Agitation.  Anxiety.  Confusion.  Unusual behavior, such as hyperactivity, fear of water (hydrophobia), or fear of air (aerophobia).  Hallucinations.  Insomnia.  Weakness in the arms or legs.  Difficulty swallowing. Most people get sick in 1 3 months after being bitten. This often varies and may depend on the location of the bite. The infection will take less time to develop if the bite occurred closer to the head.  DIAGNOSIS  To determine if a person is infected, several tests must be performed, such as:  A skin biopsy.  A saliva test.  A lumbar puncture to remove spinal fluid so it can be examined.  Blood tests. TREATMENT  Treatment to  prevent the infection from developing (post-exposure prophylaxis, PEP) is often started before knowing for sure if the person has been exposed to the rabies virus. PEP involves cleaning the wound, giving an antibody injection (rabies immune globulin), and giving a series of rabies vaccine injections. The series of injections are usually given over a two-week period. If possible, the animal that bit the person will be observed to see if it remains healthy. If the animal has been killed, it can be sent to a state laboratory and examined to see if the animal had rabies. If a person is bitten by a domestic animal (dog, cat, or ferret) that appears healthy and can be observed to see if it remains healthy, often no further treatment is necessary other than care of the wounds caused by the animal. Rabies is often a fatal illness once the infection develops in a person. Although a few people who developed rabies have survived after experimental treatment with certain drugs, all these survivors still had severe nervous system problems after the treatment. This is why caregivers use extra caution and begin PEP treatment for people who have been bitten by animals that are possibly infected with rabies.  HOME CARE INSTRUCTIONS  If you were bitten by an unknown animal, make sure you know your caregiver's instructions for follow-up. If the animal was sent to a laboratory for examination, ask when the test results will be ready. Make sure you get the test results.  Take these steps to care for your wound:  Keep  the wound clean, dry, and dressed as directed by your caregiver.  Keep the injured part elevated as much as possible.  Do not resume use of the affected area until directed.  Only take over-the-counter or prescription medicines as directed by your caregiver.  Keep all follow-up appointments as directed by your caregiver. PREVENTION  To prevent rabies, people need to reduce their risk of having contact with  infected animals.   Make sure your pets (dogs, cats, ferrets) are vaccinated against rabies. Keep these vaccinations up-to-date as directed by your veterinarian.  Supervise your pets when they are outside. Keep them away from wild animals.  Call your local animal control services to report any stray animals. These animals may not be vaccinated.  Stay away from stray or wild animals.  Consider getting the rabies vaccine (preexposure) if you are traveling to an area where rabies is common or if your job or activities involve possible contact with wild or stray animals. Discuss this with your caregiver. Document Released: 04/19/2005 Document Revised: 01/12/2012 Document Reviewed: 11/16/2011 [facility] Patient Information 2014 [place], [place].  Upper Respiratory Infection, Adult An upper respiratory infection (URI) is also known as the common cold. It is often caused by a type of germ (virus). Colds are easily spread (contagious). You can pass it to others by kissing, coughing, sneezing, or drinking out of the same glass. Usually, you get better in 1 or 2 weeks.  HOME CARE   Only take medicine as told by your doctor.  Use a warm mist humidifier or breathe in steam from a hot shower.  Drink enough water and fluids to keep your pee (urine) clear or pale yellow.  Get plenty of rest.  Return to work when your temperature is back to normal or as told by your doctor. You may use a face mask and wash your hands to stop your cold from spreading. GET HELP RIGHT AWAY IF:   After the first few days, you feel you are getting worse.  You have questions about your medicine.  You have chills, shortness of breath, or brown or red spit (mucus).  You have yellow or brown snot (nasal discharge) or pain in the face, especially when you bend forward.  You have a fever, puffy (swollen) neck, pain when you swallow, or white spots in the back of your throat.  You have a bad headache, ear pain, sinus pain,  or chest pain.  You have a high-pitched whistling sound when you breathe in and out (wheezing).  You have a lasting cough or cough up blood.  You have sore muscles or a stiff neck. MAKE SURE YOU:   Understand these instructions.  Will watch your condition.  Will get help right away if you are not doing well or get worse. Document Released: 10/06/2007 Document Revised: 07/12/2011 Document Reviewed: 08/24/2010 [facility] Patient Information 2014 [place], [place].  Rabies Vaccine What You Need to Know WHAT IS RABIES?  Rabies is a serious disease. It is caused by a virus.  Rabies is mainly a disease of animals. Humans get rabies when they are bitten by infected animals.  At first there might not be any symptoms. But weeks, or even years after a bite, rabies can cause pain, fatigue, headaches, fever, and irritability. These are followed by seizures, hallucinations, and paralysis. Human rabies is almost always fatal.  Wild animals, especially bats, are the most common source of human rabies infection in the [place]. Skunks, raccoons, dogs, cats, coyotes, foxes, and other mammals  can also transmit the disease.  Human rabies is rare in the [place]. There have been only 55 cases diagnosed since 1990. However, between 16,000 and 39,000 people are vaccinated each year as a precaution after animal bites. Also, rabies is far more common in other parts of the world, with about 40,000 to 70,000 rabies-related deaths worldwide each year. Bites from unvaccinated dogs cause most of these cases. Rabies vaccine can prevent rabies. RABIES VACCINE  Rabies vaccine is given to people at high risk of rabies to protect them if they are exposed. It can also prevent the disease if it is given to a person after they have been exposed.  Rabies vaccine is made from killed rabies virus. It cannot cause rabies. WHO SHOULD GET RABIES VACCINE AND WHEN? Preventive Vaccination (No Exposure)  People at  high risk of exposure to rabies, such as veterinarians, Educational psychologist, rabies laboratory workers, spelunkers, and rabies biologics production workers should be offered rabies vaccine.  The vaccine should also be considered for:  People whose activities bring them into frequent contact with rabies virus or with possibly rabid animals.  International travelers who are likely to come in contact with animals in parts of the world where rabies is common.  The pre-exposure schedule for rabies vaccination is 3 doses, given at the following times:  Dose 1: As appropriate.  Dose 2: 7 days after Dose 1.  Dose 3: 21 days or 28 days after Dose 1.  For laboratory workers and others who may be repeatedly exposed to rabies virus, periodic testing for immunity is recommended and booster doses should be given as needed. (Testing or booster doses are not recommended for travelers). Ask your doctor for details. Vaccination After an Exposure Anyone who has been bitten by an animal, or who otherwise may have been exposed to rabies, should clean the wound and see a doctor immediately. The doctor will determine if they need to be vaccinated. A person who is exposed and has never been vaccinated against rabies should get 4 doses of rabies vaccine: one dose right away and additional doses on the 3rd, 7th, and 14th days. They should also get another shot called Rabies Immune Globulin at the same time as the first dose.  A person who has been previously vaccinated should get 2 doses of rabies vaccine: one right away and another on the 3rd day. Rabies Immune Globulin is not needed. TELL YOUR DOCTOR IF: Talk with a doctor before getting rabies vaccine if you:  Ever had a serious (life-threatening) allergic reaction to a previous dose of rabies vaccine or to any component of the vaccine; tell your doctor if you have any severe allergies.  Have a weakened immune system because of:  HIV, AIDS, or another disease that  affects the immune system.  Treatment with drugs that affect the immune system, such as steroids.  Cancer or cancer treatment with radiation or drugs. If you have a minor illness, such as a cold, you can be vaccinated. If you are moderately or severely ill, you should probably wait until you recover before getting a routine (non-exposure) dose of rabies vaccine. If you have been exposed to rabies virus, you should get the vaccine regardless of any other illnesses you may have. WHAT ARE THE RISKS FROM RABIES VACCINE? A vaccine, like any medicine, is capable of causing serious problems, such as severe allergic reactions. The risk of a vaccine causing serious harm, or death, is extremely small. Serious problems from rabies vaccine  are very rare.  Mild problems:  Soreness, redness, swelling, or itching where the shot was given (30% to 74%).  Headache, nausea, abdominal pain, muscle aches, or dizziness (5% to 40%). Moderate problems:  Hives, pain in the joints, or fever (about 6% of booster doses).  Other nervous system disorders, such as Guillain-Barr Syndrome (GBS), have been reported after rabies vaccine, but this happens so rarely that it is not known whether they are related to the vaccine. Note: Several brands of rabies vaccine are available in the [place], and reactions may vary between brands. Your provider can give you more information about a particular brand. WHAT IF THERE IS A SERIOUS REACTION? What should I look for? Look for anything that concerns you, such as signs of a severe allergic reaction, very high fever, or behavior changes.  Signs of a severe allergic reaction can include hives, swelling of the face and throat, difficulty breathing, a fast heartbeat, dizziness, and weakness. These would start a few minutes to a few hours after the vaccination. What should I do?  If you think it is a severe allergic reaction or other emergency that cannot wait, call 911 or get the  person to the nearest hospital. Otherwise, call your doctor.  Afterward, the reaction should be reported to the Vaccine Adverse Event Reporting System (VAERS). Your doctor might file this report, or you can do it yourself through the VAERS website at www.vaers.LAgents.nohhs.gov or by calling 1-(860) 810-5466. VAERS is only for reporting reactions. They do not give medical advice. HOW CAN I LEARN MORE?  Ask your doctor.  Call your local or state health department.  Contact the Centers for Disease Control and Prevention (CDC):  Visit the CDC rabies website at wwwcrv.comwww.cdc.gov/rabies/ CDC Rabies Vaccine VIS (02/06/08) Document Released: 02/14/2006 Document Revised: 04/05/2012 Document Reviewed: 08/09/2012 [facility] Patient Information 2014 [place]

## 2013-10-21 IMAGING — CT CT ANGIO CHEST
1 of 3 series · 19 of 32 positions shown · IV contrast (APPLIED)
Comparison: 08/25/2012 radiograph

CLINICAL DATA: Shortness of breath.

CT ANGIOGRAPHY CHEST
TECHNIQUE: Multidetector CT imaging of the chest using the
standard protocol during bolus administration of intravenous
contrast. Multiplanar reconstructed images including MIPs were
obtained and reviewed to evaluate the vascular anatomy.
Contrast: 80mL OMNIPAQUE IOHEXOL 350 MG/ML SOLN

[Series 7: pulm embolism 1.0 b25f st · axial · 0.63mm/px · z∈[-266,-54]mm · 19 of 237 slices shown]
[im 12/237  lung]
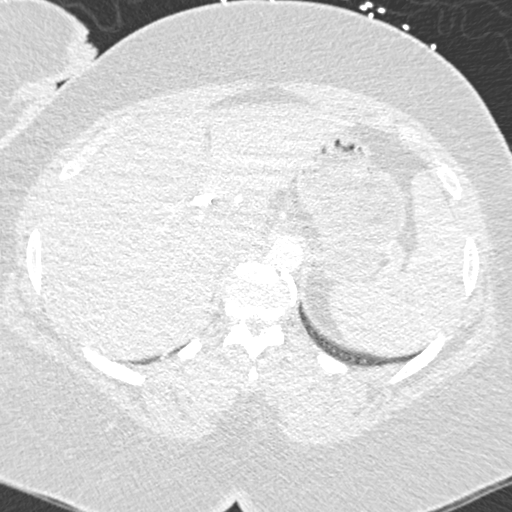
[im 24/237  mediastinal]
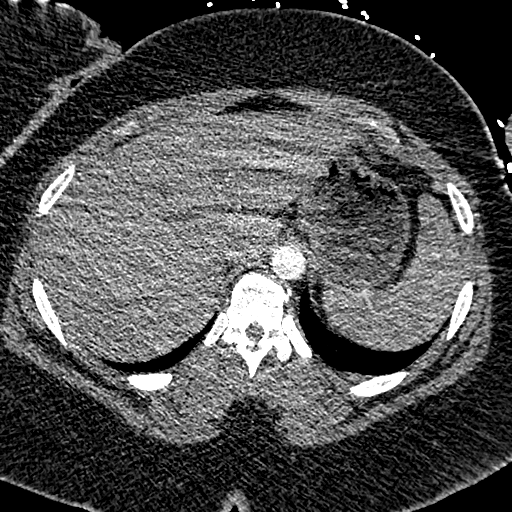
[im 36/237  lung]
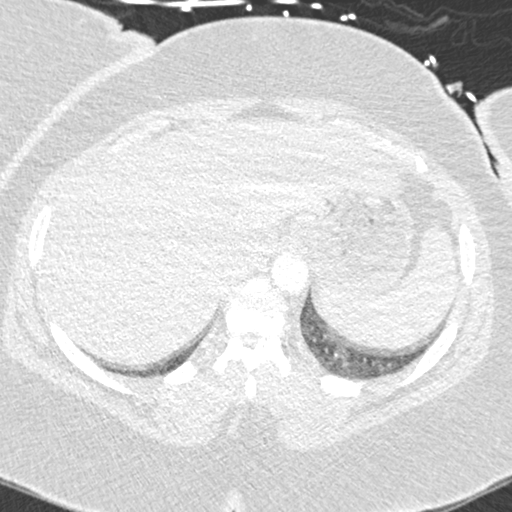
[im 60/237  mediastinal]
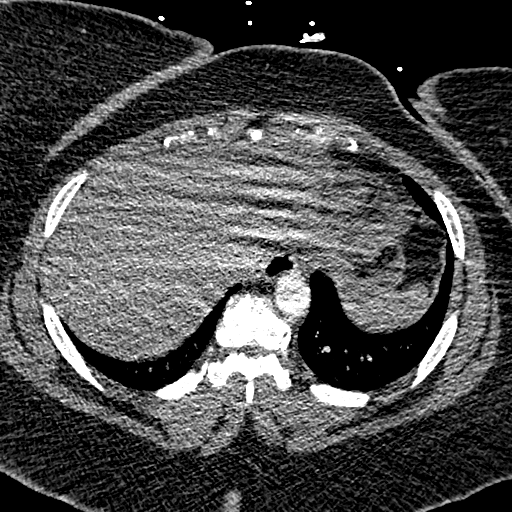
[im 71/237  lung]
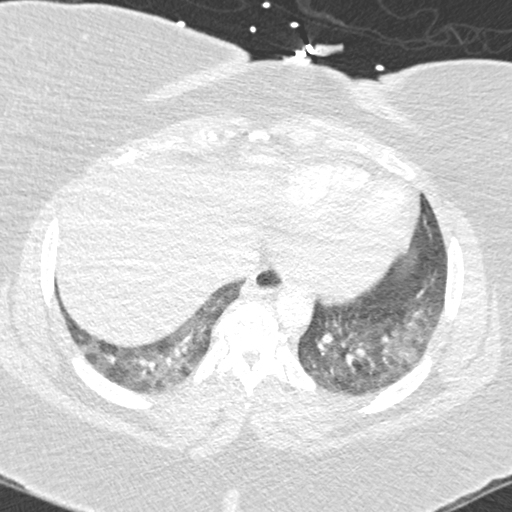
[im 79/237  mediastinal]
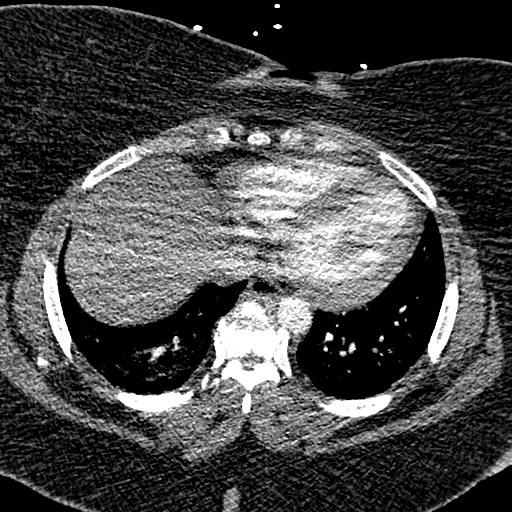
[im 83/237  lung]
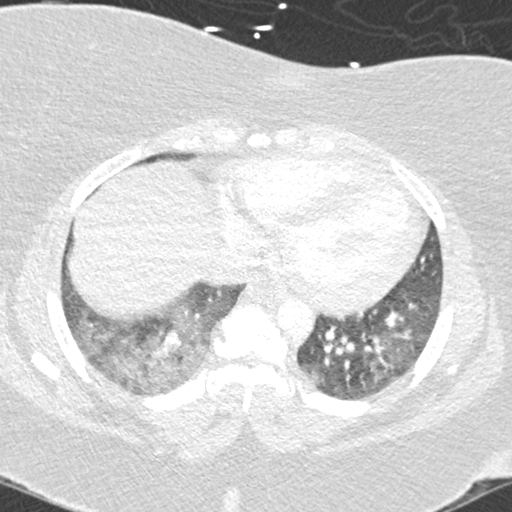
[im 95/237  mediastinal]
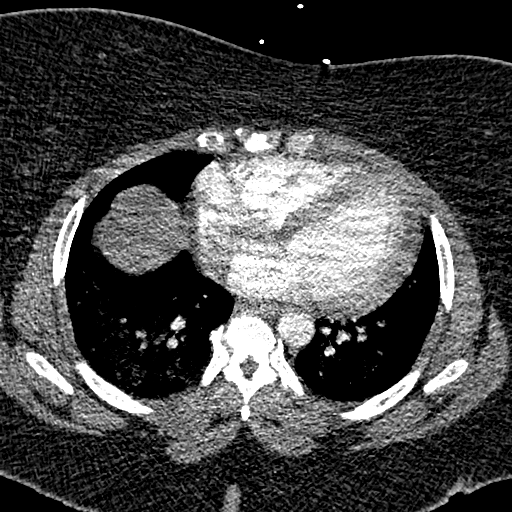
[im 107/237  lung]
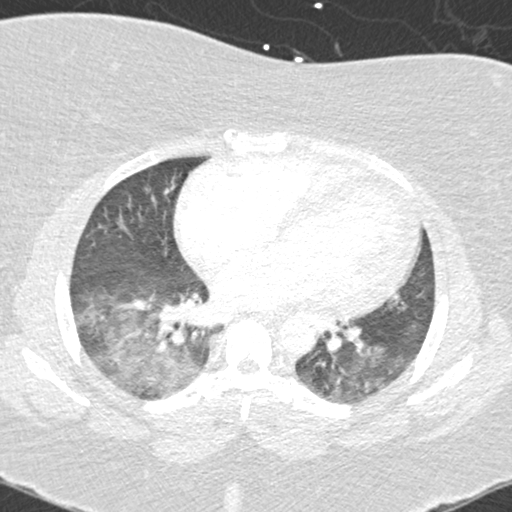
[im 119/237  mediastinal]
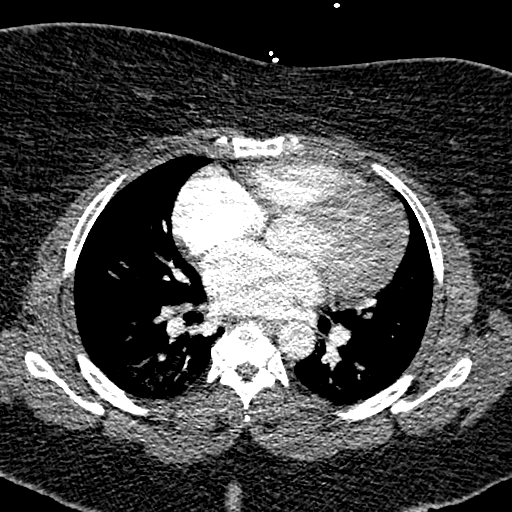
[im 130/237  lung]
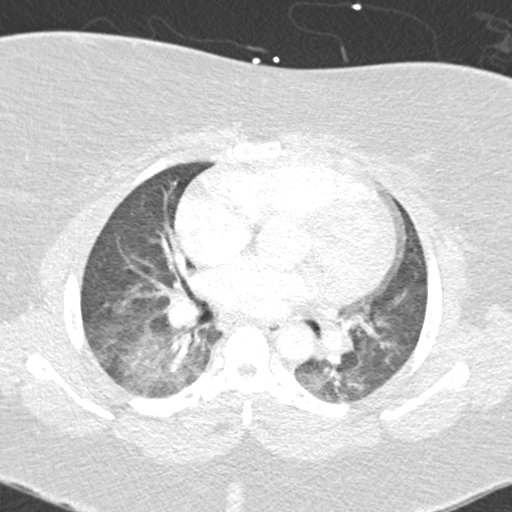
[im 142/237  mediastinal]
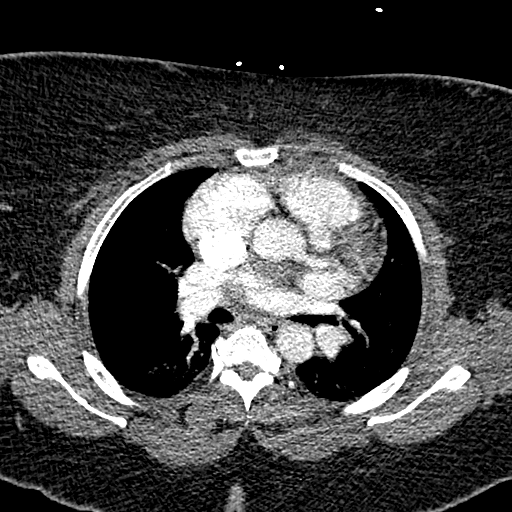
[im 154/237  lung]
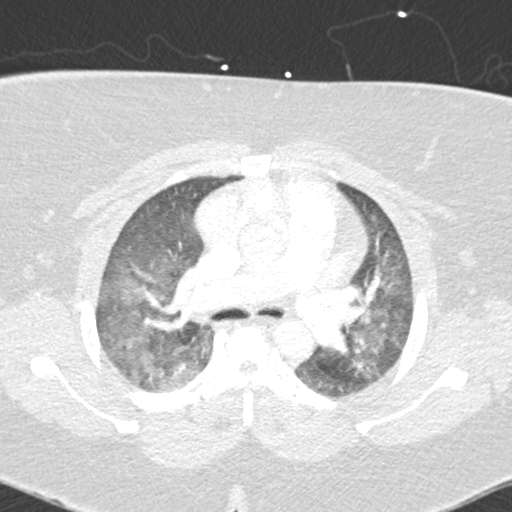
[im 158/237  mediastinal]
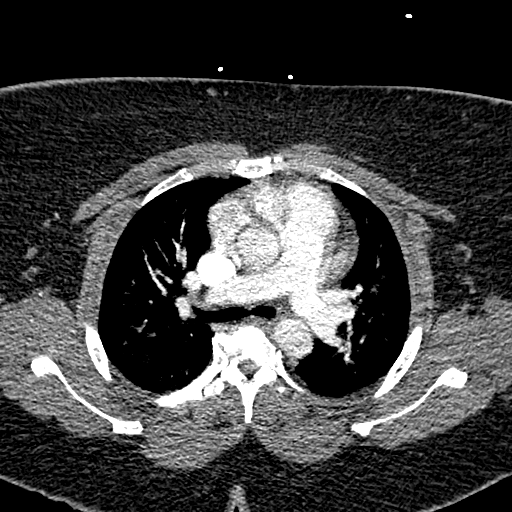
[im 166/237  lung]
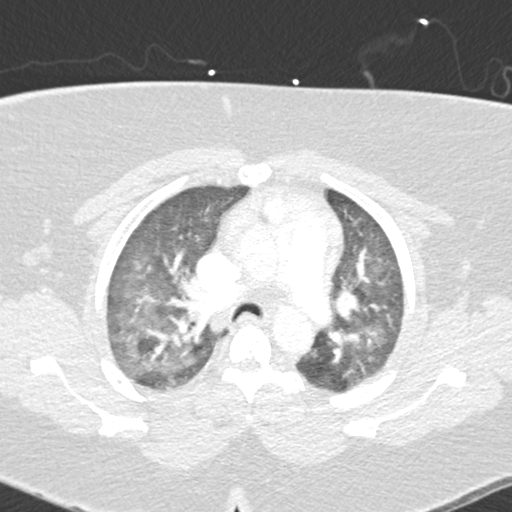
[im 178/237  mediastinal]
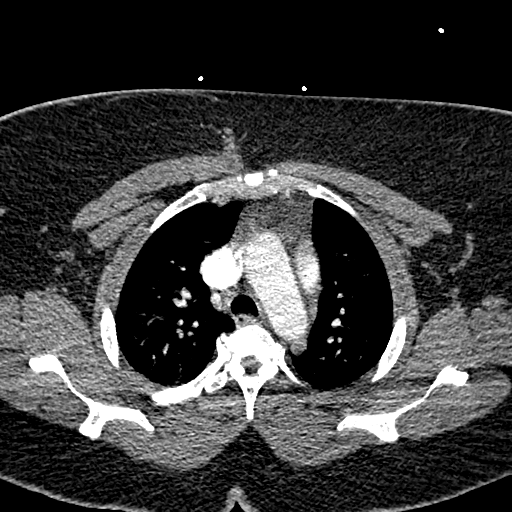
[im 201/237  lung]
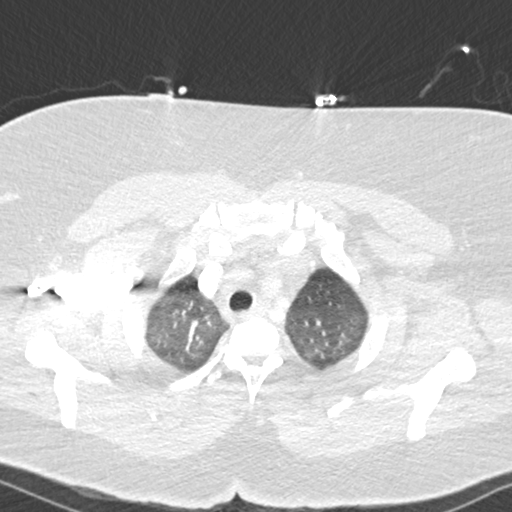
[im 213/237  mediastinal]
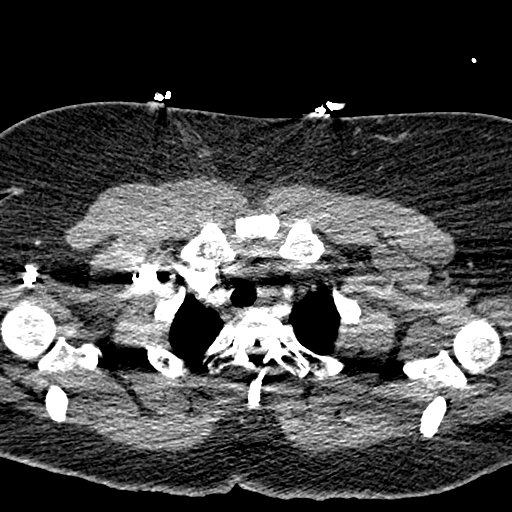
[im 225/237  lung]
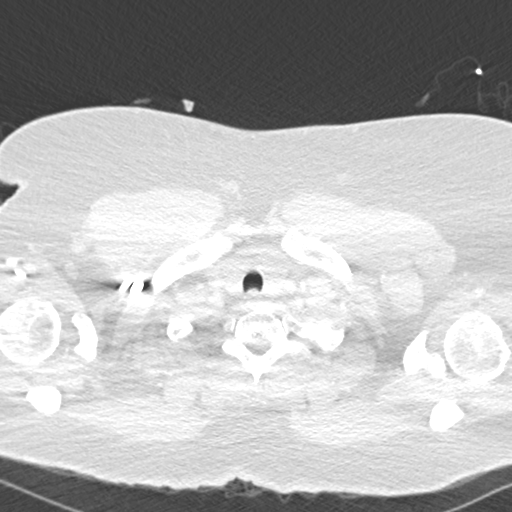

[19 of 32 positions shown; findings below may reference images not displayed]

FINDINGS: Contrast bolus timing suboptimal.  Allowing for this, no
main or central lobar pulmonary arterial branch filling defect.
More peripheral branches are inadequately opacified.

Normal caliber aorta.

Cardiomegaly.  No pleural or pericardial effusion.

Limited images through the upper abdomen show no acute finding.
Cholecystectomy.  Reactive sized mediastinal and hilar lymph nodes.

Bilateral ground-glass opacities, right greater than left, within
the upper and lower lobes.  No pneumothorax.  Central airways are
in expiration however patent.

No acute osseous finding.
IMPRESSION: Suboptimal contrast bolus timing.  No main or central lobar
pulmonary embolism.  The more peripheral branches are
nondiagnostic.

Cardiomegaly.

Bilateral ground-glass opacities may reflect pulmonary edema,
infectious or inflammatory pneumonitis.

## 2014-03-07 IMAGING — CR DG CHEST 2V
2 series · 2 of 2 positions shown · non-contrast
Comparison: 08/25/2012

CLINICAL DATA: Headache and chest pain.  History of hypertension,
CHF.

CHEST - 2 VIEW

[w chest pa]
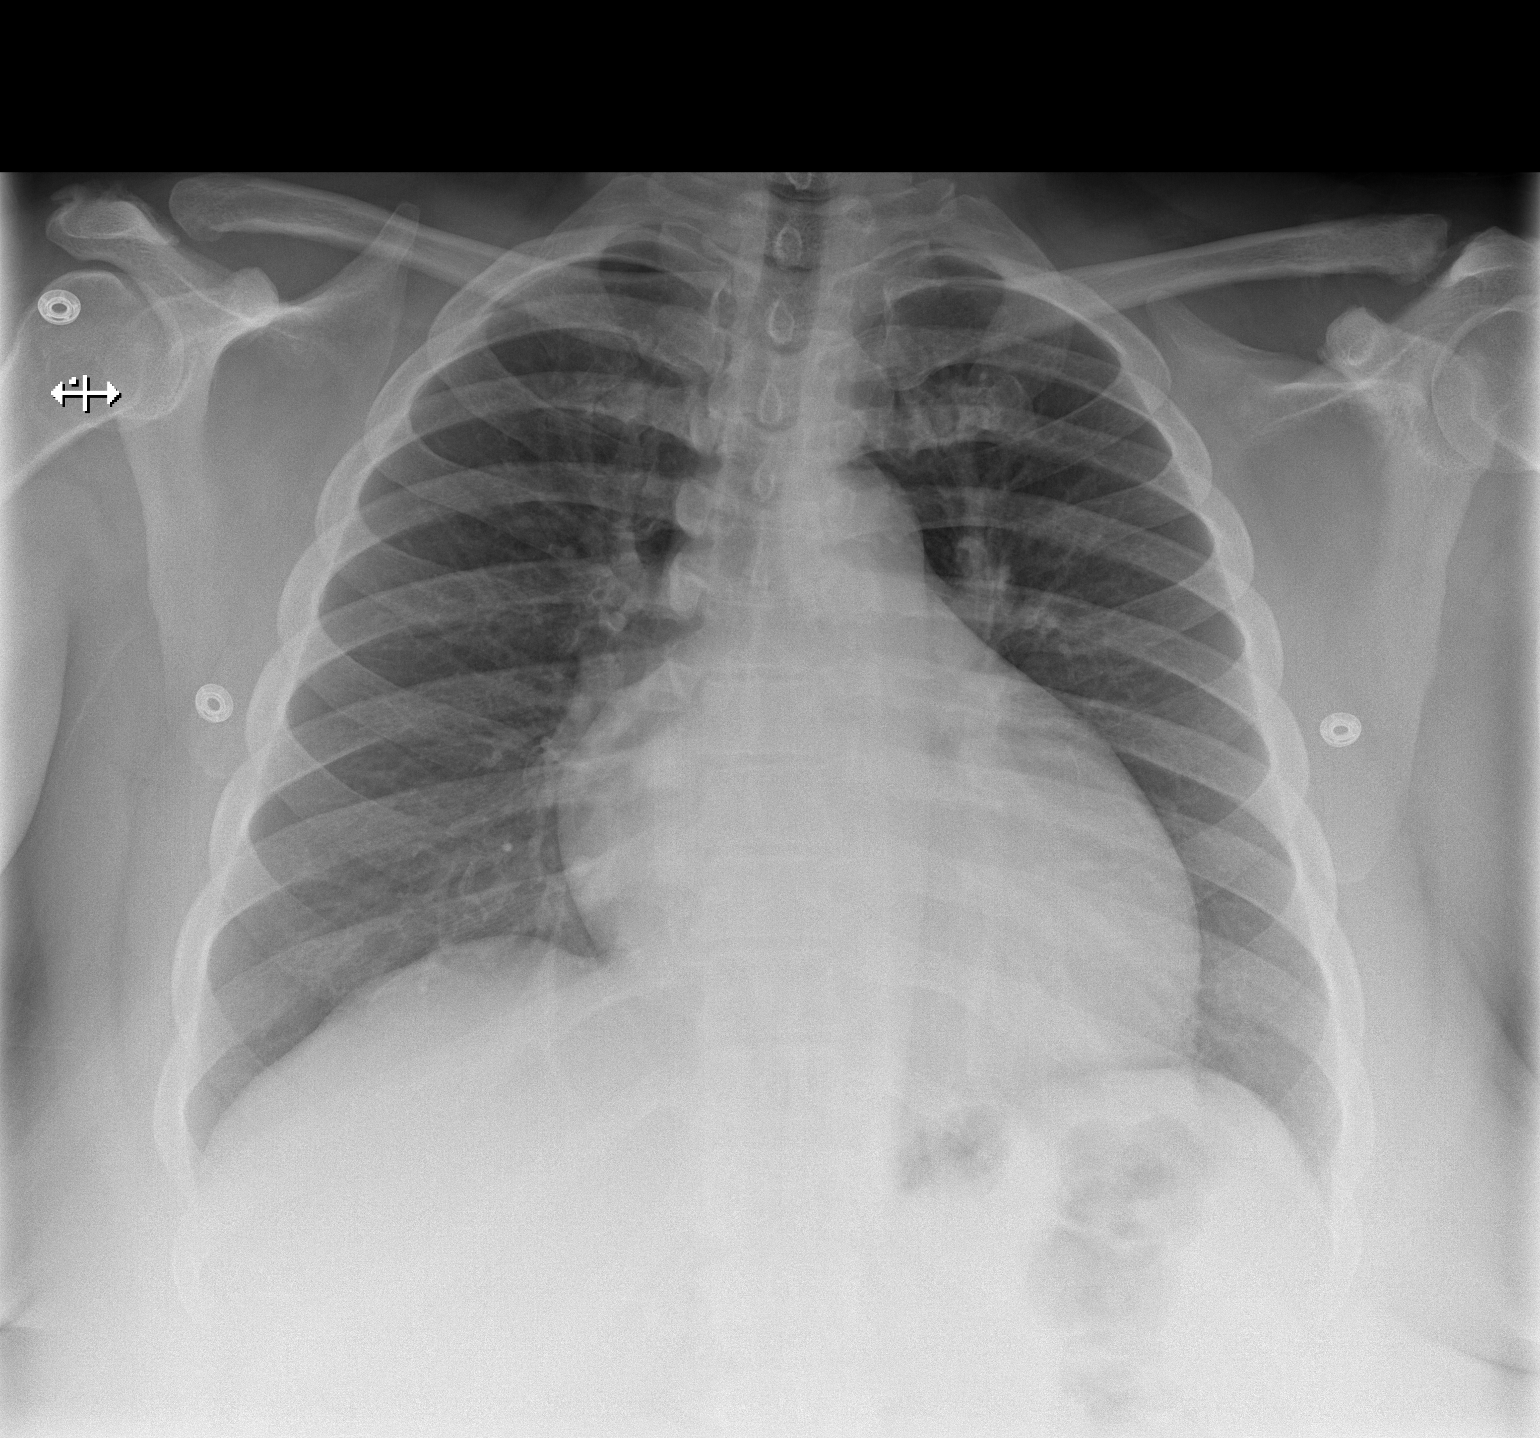

[w chest lat]
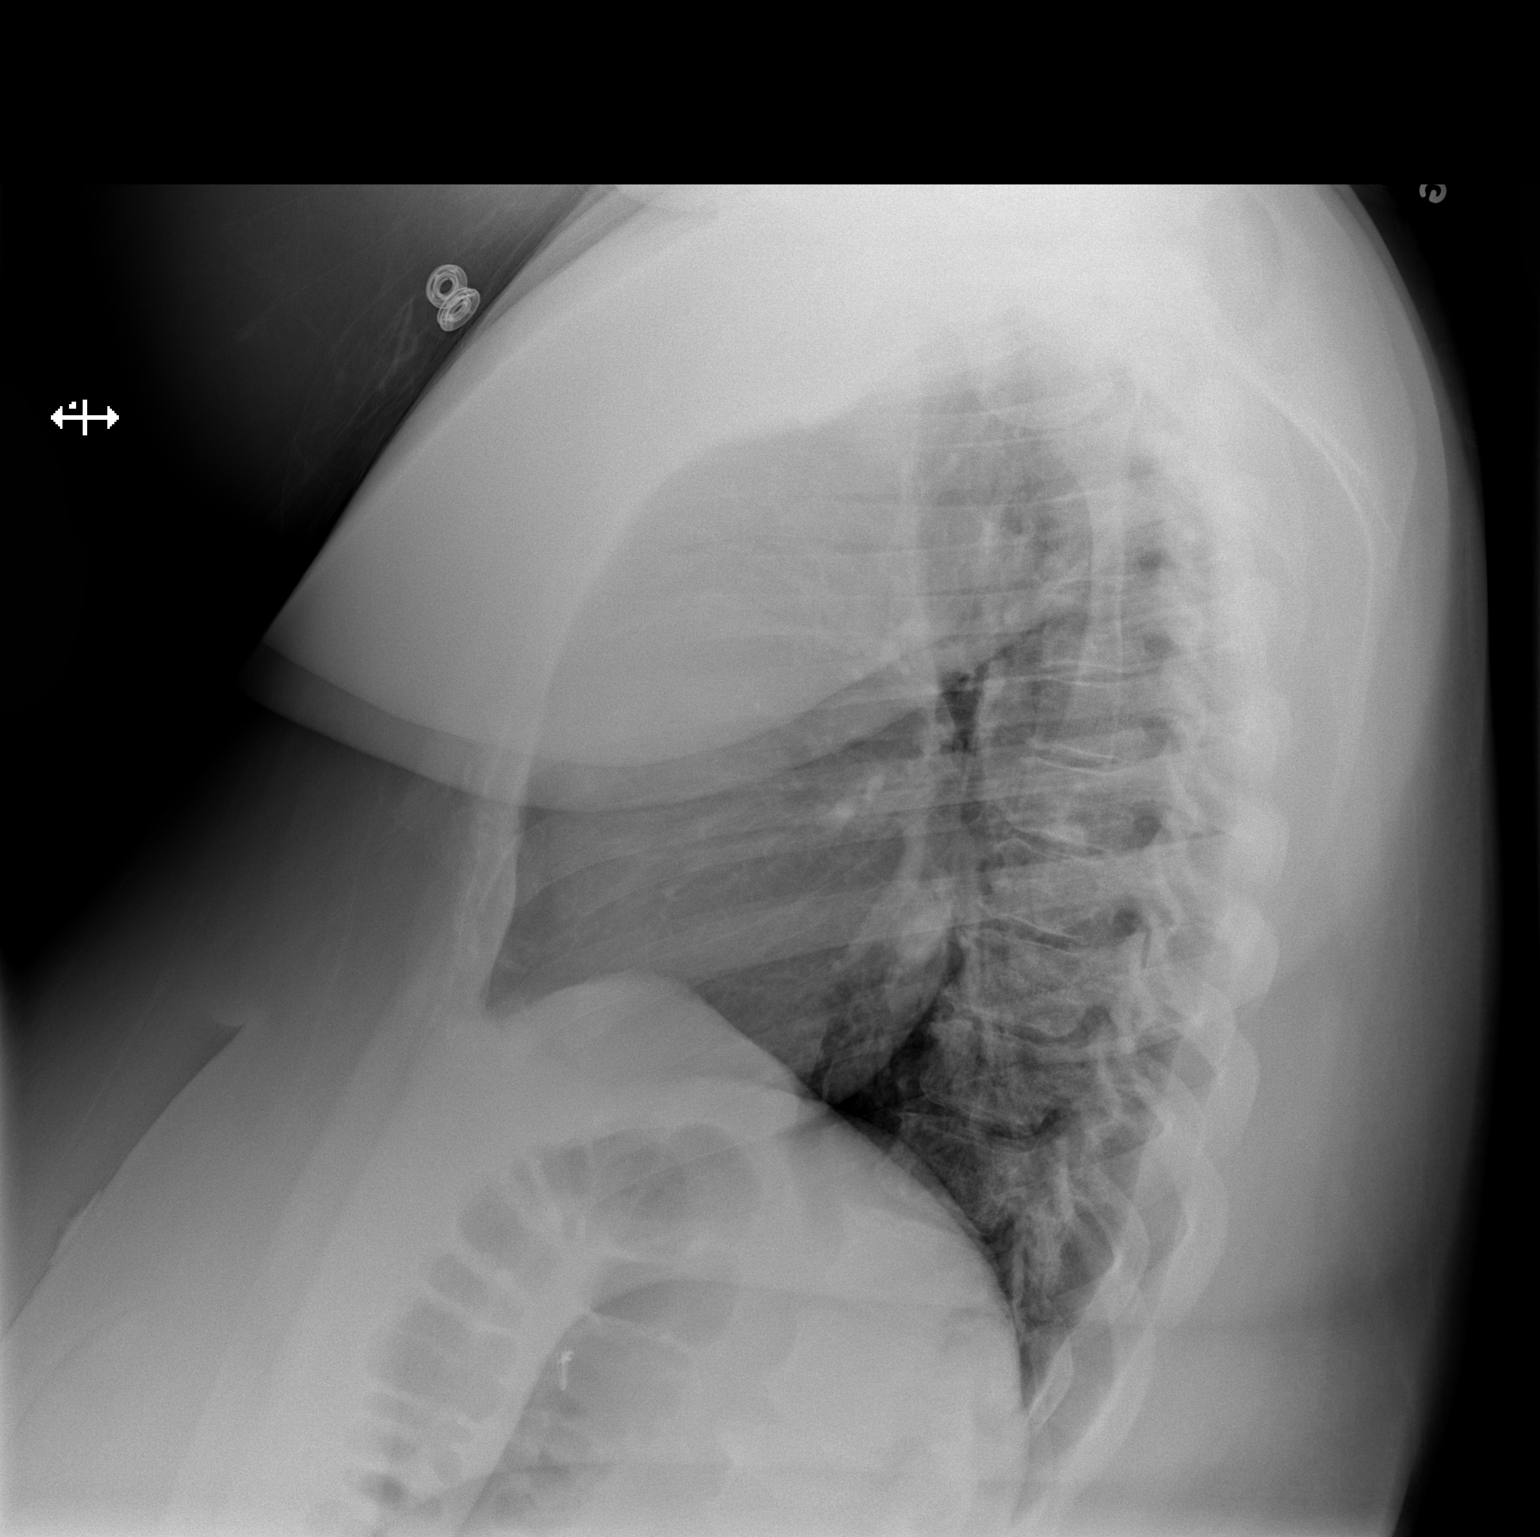

[2 of 2 positions shown; findings below may reference images not displayed]

FINDINGS: Heart is enlarged.  Lungs are clear.  No pulmonary edema.
Mild thoracic and lumbar degenerative changes are present.
Surgical clips are present in the upper abdomen.
IMPRESSION: Cardiomegaly.
